# Patient Record
Sex: Female | Born: 1935
Health system: Southern US, Community
[De-identification: ages and names within clinical notes are randomized; demographics above are authoritative.]

## PROBLEM LIST (undated history)

## (undated) DIAGNOSIS — G259 Extrapyramidal and movement disorder, unspecified: Secondary | ICD-10-CM

## (undated) DIAGNOSIS — J3489 Other specified disorders of nose and nasal sinuses: Secondary | ICD-10-CM

## (undated) DIAGNOSIS — H539 Unspecified visual disturbance: Secondary | ICD-10-CM

## (undated) DIAGNOSIS — M1990 Unspecified osteoarthritis, unspecified site: Secondary | ICD-10-CM

## (undated) DIAGNOSIS — K5900 Constipation, unspecified: Secondary | ICD-10-CM

## (undated) DIAGNOSIS — I639 Cerebral infarction, unspecified: Secondary | ICD-10-CM

## (undated) DIAGNOSIS — N189 Chronic kidney disease, unspecified: Secondary | ICD-10-CM

## (undated) DIAGNOSIS — F419 Anxiety disorder, unspecified: Secondary | ICD-10-CM

## (undated) HISTORY — DX: Chronic kidney disease, unspecified: N18.9

## (undated) HISTORY — DX: Unspecified visual disturbance: H53.9

## (undated) HISTORY — PX: JOINT REPLACEMENT: SHX530

## (undated) HISTORY — PX: OTHER SURGICAL HISTORY: SHX169

## (undated) HISTORY — DX: Extrapyramidal and movement disorder, unspecified: G25.9

## (undated) HISTORY — PX: EYE SURGERY: SHX253

---

## 1998-04-01 ENCOUNTER — Other Ambulatory Visit: Admission: RE | Admit: 1998-04-01 | Discharge: 1998-04-01 | Payer: Self-pay | Admitting: Gynecology

## 1999-05-12 ENCOUNTER — Other Ambulatory Visit: Admission: RE | Admit: 1999-05-12 | Discharge: 1999-05-12 | Payer: Self-pay | Admitting: Gynecology

## 1999-07-17 ENCOUNTER — Ambulatory Visit (HOSPITAL_COMMUNITY): Admission: RE | Admit: 1999-07-17 | Discharge: 1999-07-17 | Payer: Self-pay | Admitting: Vascular Surgery

## 1999-07-17 ENCOUNTER — Encounter: Payer: Self-pay | Admitting: Vascular Surgery

## 2000-06-07 ENCOUNTER — Other Ambulatory Visit: Admission: RE | Admit: 2000-06-07 | Discharge: 2000-06-07 | Payer: Self-pay | Admitting: Gynecology

## 2003-08-20 ENCOUNTER — Other Ambulatory Visit: Admission: RE | Admit: 2003-08-20 | Discharge: 2003-08-20 | Payer: Self-pay | Admitting: Gynecology

## 2005-09-01 ENCOUNTER — Other Ambulatory Visit: Admission: RE | Admit: 2005-09-01 | Discharge: 2005-09-01 | Payer: Self-pay | Admitting: Gynecology

## 2008-10-02 ENCOUNTER — Encounter: Admission: RE | Admit: 2008-10-02 | Discharge: 2008-10-02 | Payer: Self-pay | Admitting: Orthopedic Surgery

## 2008-10-18 ENCOUNTER — Inpatient Hospital Stay (HOSPITAL_COMMUNITY): Admission: RE | Admit: 2008-10-18 | Discharge: 2008-10-22 | Payer: Self-pay | Admitting: Orthopedic Surgery

## 2009-04-01 ENCOUNTER — Ambulatory Visit (HOSPITAL_COMMUNITY): Admission: RE | Admit: 2009-04-01 | Discharge: 2009-04-01 | Payer: Self-pay | Admitting: Orthopedic Surgery

## 2009-09-04 ENCOUNTER — Ambulatory Visit (HOSPITAL_COMMUNITY): Admission: RE | Admit: 2009-09-04 | Discharge: 2009-09-04 | Payer: Self-pay | Admitting: Orthopedic Surgery

## 2010-07-01 ENCOUNTER — Encounter
Admission: RE | Admit: 2010-07-01 | Discharge: 2010-07-01 | Payer: Self-pay | Source: Home / Self Care | Attending: Family Medicine | Admitting: Family Medicine

## 2010-07-24 ENCOUNTER — Ambulatory Visit (HOSPITAL_COMMUNITY)
Admission: RE | Admit: 2010-07-24 | Discharge: 2010-07-24 | Payer: Self-pay | Source: Home / Self Care | Attending: Urology | Admitting: Urology

## 2010-07-24 ENCOUNTER — Encounter (INDEPENDENT_AMBULATORY_CARE_PROVIDER_SITE_OTHER): Payer: Self-pay

## 2010-08-03 ENCOUNTER — Encounter: Payer: Self-pay | Admitting: Family Medicine

## 2010-10-01 LAB — CREATININE, SERUM: Creatinine, Ser: 0.89 mg/dL (ref 0.4–1.2)

## 2010-10-22 LAB — BASIC METABOLIC PANEL
BUN: 20 mg/dL (ref 6–23)
Chloride: 104 mEq/L (ref 96–112)
Creatinine, Ser: 0.97 mg/dL (ref 0.4–1.2)
Glucose, Bld: 101 mg/dL — ABNORMAL HIGH (ref 70–99)
Potassium: 4 mEq/L (ref 3.5–5.1)

## 2010-10-22 LAB — HEMOGLOBIN AND HEMATOCRIT, BLOOD
HCT: 31.7 % — ABNORMAL LOW (ref 36.0–46.0)
Hemoglobin: 10.7 g/dL — ABNORMAL LOW (ref 12.0–15.0)

## 2010-10-22 LAB — TYPE AND SCREEN: Antibody Screen: NEGATIVE

## 2010-11-25 NOTE — Op Note (Signed)
[name], [name]               ACCOUNT NO.:  [id number]   MEDICAL RECORD NO.:  [id number]          PATIENT TYPE:  INP   LOCATION:  0003                         FACILITY:  [facility]   PHYSICIAN:  Marlowe Kays, M.D.  DATE OF BIRTH:  10-Oct-1923   DATE OF PROCEDURE:  10/18/2008  DATE OF DISCHARGE:                               OPERATIVE REPORT   PREOPERATIVE DIAGNOSIS:  Spinal stenosis L2-3, L3-4, L4-5.   POSTOPERATIVE DIAGNOSIS:  Spinal stenosis L2-3, L3-4, L4-5.   OPERATION:  Central and foraminal decompression L2-3, L3-4, L4-5.   SURGEON:  Marlowe Kays, M.D.   ASSISTANT:  Georges Lynch. Darrelyn Hillock, M.D.   ANESTHESIA:  General.   INDICATIONS FOR PROCEDURE:  She is having primary left groin and thigh  pain.  She has a total hip replacement on the right and some mild  arthritic changes in the left hip, but it was felt because of the type  of symptomatology she was having that her pain was primarily coming from  her back and not her hip joint.  She understands that she does have an  abnormal hip as well.   PROCEDURE:  Prophylactic antibiotics, Foley catheter was inserted,  carefully placed in the prone position on the Wilson frame.  Back was  prepped with DuraPrep, draped in sterile field.  Time-out performed.  I  made a midline incision and the spinous processes of what I thought were  probably about L2 and L4 were tagged with Kocher clamps and a lateral x-  ray was taken confirming that this was the case.  We extended the  incision more proximally and distally so we could go up to the L1  spinous process and down to L5.  We took second lateral x-ray with  Kocher clamps, confirming that the clamps were on L1 and L5.  Based on  this, we continued dissecting soft tissue off the neural arches in this  span, placed two self-retaining McCullough retractors.  With double-  action rongeur initially, I removed bone, ligamentum flavum, taking a  substantial portion of the spinous process L5  and most of L2 and all  bone and soft tissue in between.  We then continued initially with 2-  and 3-mm Kerrison rongeurs but soon brought in the microscope to  complete the dissection.  Her nerve roots on the left side were all very  irritable.  Wide decompression performed without any dural tear or known  complication.  We could place a hockey-stick cephalad, caudad and into  all the neural foramina without and everything was patent.  I irrigated  the wound well with sterile saline, placed Gelfoam soaked in thrombin  over the dura.  Self-retaining retractors were removed and there was no  unusual bleeding.  I then closed the wound in layers with interrupted #1  Vicryl and the paralumbar muscle and fascia leaving a 1.5 cm or so open  distally for any egress of fluid.  Subcutaneous tissue was closed with 2-  0 Vicryl,  staples in the skin.  Betadine, Adaptic and dry sterile dressing were  applied.  At the time of  this dictation, she is being taken to the  recovery room in satisfactory condition with no known complications.   ESTIMATED BLOOD LOSS:  250 mL.  No blood replacement.           ______________________________  Marlowe Kays, M.D.     JA/MEDQ  D:  10/18/2008  T:  10/18/2008  Job:  [id number]

## 2010-11-25 NOTE — H&P (Signed)
NAMEJORDYNN, [name]               ACCOUNT NO.:  [id number]   MEDICAL RECORD NO.:  [id number]          PATIENT TYPE:  INP   LOCATION:                               FACILITY:  [facility]   PHYSICIAN:  Marlowe Kays, M.D.  DATE OF BIRTH:  1924/03/03   DATE OF ADMISSION:  10/18/2008  DATE OF DISCHARGE:                              HISTORY & PHYSICAL   CHIEF COMPLAINT:  Pain in my left groin and thigh.   PRESENT ILLNESS:  This is an 77 year old white female seen by [place] for  progressive and interfering problems concerning pain in her left groin  and thigh.  At first, she and we thought it may be hip pathology as she  has successfully undergone a right total hip replacement arthroplasty  but x-rays have proved that she has minimal degenerative changes in her  left hip joint as well as negative range of motion.  Her pain is coming  from the lower lumbar spine, radiating around the buttock into the upper  lateral thigh and into the groin area or proximal inner thigh.  Standing  markedly increases her pain and discomfort, is relieved somewhat with  lying down and sitting.  She is an extremely active lady who is now  widowed, living alone and she highly desires to continue with her day-to-  day activities and not to have this interfere with her overall quality  of life.  CT scan myelogram has shown considerable amount of spinal  stenosis at the L2-L3, L3-L4, L4-L5 levels of the lumbar spine.  After  much discussion including the risks and benefits of the surgery, decided  to go ahead and schedule a decompressive lumbar laminectomy from L2-L5.  All questions encouraged and answered today following her history and  physical.  She plans to go to [facility] for her post hospitalization  rehabilitation and she will try to make as many arrangements as possible  for that prior to her hospitalization.   PAST MEDICAL HISTORY:  Again, this lady's been in excellent health  throughout her lifetime.  She  underwent right total knee replacement  arthroplasty in the not too distant past.  She has had bilateral  cataract removal with lens implants.  Dr. Huntley Dec is her family physician.   SOCIAL HISTORY:  She neither smokes nor drinks.  She is widowed and  retired.   ALLERGIES:  She has no medical allergies.   CURRENT MEDICATIONS:  1. Estrogen patch.  2. Calcium.  3. Vitamin D 81 mg daily (which she has stopped.).   FAMILY HISTORY:  Noncontributory.   REVIEW OF SYSTEMS:  CNS: No seizures or paralysis, numbness, double  vision.  RESPIRATORY: No productive cough, no hemoptysis, no shortness  of breath.  CARDIOVASCULAR: No chest pain or angina or orthopnea.  Gastrointestinal: No nausea,vomiting, melena, blood in stool.  GENITOURINARY:  No discharge, dysuria, hematuria.  MUSCULOSKELETAL:  Primarily in present illness.   PHYSICAL EXAMINATION:  GENERAL:  Alert and cooperative, friendly 77-year-  old white female looking much younger than stated age.  VITAL SIGNS:  Blood pressure 140/76, pulse 80, respirations 12.  HEENT:  Normocephalic. PERRLA, EOM intact.  Oropharynx is clear.  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  NECK:  Neck is supple, no lymphadenopathy.  CHEST:  Clear to auscultation, no rhonchi, no rales.  HEART: Regular rate and rhythm.  No murmurs are heard.  ABDOMEN:  Scaphoid, soft, nontender, spleen not felt.  GENITALIA, RECTAL, PELVIC, BREASTS:  Not done, not pertinent to present  illness.  EXTREMITIES:  Negative straight leg raise bilaterally.  She has gross  motor strength at 5/5.   ADMISSION DIAGNOSES:  1. Spinal stenosis L2-L3, L3-L4, L4-L5,  2. Status post right total hip replacement arthroplasty.  3. Status post bilateral cataract removal with lens implants.   PLAN:  The patient will undergo decompressive lumbar laminectomy of 2-5  with plans for rehabilitation after regular hospitalization at  [facility].      [name] June.     ______________________________  Marlowe Kays, M.D.    DLU/MEDQ  D:  10/10/2008  T:  10/10/2008  Job:  [id number]

## 2010-11-25 NOTE — Discharge Summary (Signed)
[name], [name]               ACCOUNT NO.:  [id number]   MEDICAL RECORD NO.:  [id number]          PATIENT TYPE:  INP   LOCATION:  1540                         FACILITY:  [facility]   PHYSICIAN:  Marlowe Kays, M.D.  DATE OF BIRTH:  01-Oct-1923   DATE OF ADMISSION:  10/18/2008  DATE OF DISCHARGE:  10/22/2008                               DISCHARGE SUMMARY   PREOPERATIVE DIAGNOSES:  1. Spinal stenosis, L2-3, L3-4, L4-5.  2. Osteoarthritis, left hip.   POSTOPERATIVE DIAGNOSES:  1. Spinal stenosis, L2-3, L3-4, L4-5.  2. Osteoarthritis, left hip.   OPERATION:  Central and foraminal decompression, L2-3, L3-4 and L4-5 on  October 14, 2008.   SUMMARY:  This 77 year old female was admitted at this time for central  and foraminal decompression, L2-3, L3-4 and L4-5.  She has a history of  total hip replacement on the right with good result.  She has some mild  osteoarthritis of her left hip associated with primarily left groin and  thigh pain.  The pain is more characteristic of back discomfort with no  particular pain on hip motion and a CT myelogram has shown a  considerable amount of stenosis at L2-3, L3-4 and L4-5.  She was  admitted at this time for the above-mentioned surgery with the  understanding that she did have an arthritic hip on the left and this  could be addressed later if needed.   PAST MEDICAL AND SURGICAL HISTORY:  All documented in the record and are  noncontributory except as noted above.   LABORATORY DATA:  She had a normal EKG and no lab abnormalities to  preclude surgery.   HOSPITAL COURSE:  The above-mentioned surgery was performed without  complication.  She was ambulating on day 1 and when able to use a  bedside commode and the bathroom her Foley was discontinued.  Her PCA  was discontinued on April 10.  Because she has no one to look after her  at home, arrangements were made from day 1 to be transferred to  [facility] and at this time  transfer arrangements have  been completed.  Her wound is healing nicely and she is afebrile and her  leg pain ha basically been relieved.   PLAN:  To see her back in 2 weeks from surgery in my office and to call  [phone] for an appointment.   She will be discharged on her admission medications of:  1. Aspirin 81 mg daily.  2. Vitamin D3.  3. Citracal.  4. Vivelle-Dot 0.075% one-half dose daily on Sunday and Thursday.  5. Vicodin one to two every 3 to 4 hours p.r.n. for pain.  6. Robaxin 500 mg p.o. q.6h. p.r.n. for spasm.   She does not need to wear her support hose since she is now ambulatory  and on aspirin.   CONDITION AT DISCHARGE:  Stable and improved.           ______________________________  Marlowe Kays, M.D.     JA/MEDQ  D:  10/22/2008  T:  10/22/2008  Job:  [id number]

## 2011-08-24 DIAGNOSIS — M169 Osteoarthritis of hip, unspecified: Secondary | ICD-10-CM | POA: Diagnosis not present

## 2011-08-24 DIAGNOSIS — M25569 Pain in unspecified knee: Secondary | ICD-10-CM | POA: Diagnosis not present

## 2011-12-14 DIAGNOSIS — M1710 Unilateral primary osteoarthritis, unspecified knee: Secondary | ICD-10-CM | POA: Diagnosis not present

## 2011-12-14 DIAGNOSIS — M169 Osteoarthritis of hip, unspecified: Secondary | ICD-10-CM | POA: Diagnosis not present

## 2012-02-10 DIAGNOSIS — M169 Osteoarthritis of hip, unspecified: Secondary | ICD-10-CM | POA: Diagnosis not present

## 2012-02-19 DIAGNOSIS — Z8673 Personal history of transient ischemic attack (TIA), and cerebral infarction without residual deficits: Secondary | ICD-10-CM | POA: Diagnosis not present

## 2012-02-19 DIAGNOSIS — E782 Mixed hyperlipidemia: Secondary | ICD-10-CM | POA: Diagnosis not present

## 2012-02-19 DIAGNOSIS — M25559 Pain in unspecified hip: Secondary | ICD-10-CM | POA: Diagnosis not present

## 2012-02-19 DIAGNOSIS — M810 Age-related osteoporosis without current pathological fracture: Secondary | ICD-10-CM | POA: Diagnosis not present

## 2012-02-19 DIAGNOSIS — F411 Generalized anxiety disorder: Secondary | ICD-10-CM | POA: Diagnosis not present

## 2012-02-19 DIAGNOSIS — E559 Vitamin D deficiency, unspecified: Secondary | ICD-10-CM | POA: Diagnosis not present

## 2012-02-19 DIAGNOSIS — H8110 Benign paroxysmal vertigo, unspecified ear: Secondary | ICD-10-CM | POA: Diagnosis not present

## 2012-02-24 ENCOUNTER — Encounter (HOSPITAL_COMMUNITY): Payer: Self-pay

## 2012-03-01 NOTE — Patient Instructions (Addendum)
20 [name]  03/01/2012   Your procedure is scheduled on:  03/08/12 1200noon-130pm  Report to [facility] at 0930 AM.  Call this number if you have problems the morning of surgery: [phone]   Remember:   Do not eat food:After Midnight.  May have clear liquids:until Midnight .    Take these medicines the morning of surgery with A SIP OF WATER:   Do not wear jewelry, make-up or nail polish.  Do not wear lotions, powders, or perfumes.   Do not shave 48 hours prior to surgery. .  Do not bring valuables to the hospital.  Contacts, dentures or bridgework may not be worn into surgery.  Leave suitcase in the car. After surgery it may be brought to your room.  For patients admitted to the hospital, checkout time is 11:00 AM the day of discharge.       Special Instructions: CHG Shower Use Special Wash: 1/2 bottle night before surgery and 1/2 bottle morning of surgery. shower chin to toes with CHG.  Wash face and private parts with regular soap.    Please read over the following fact sheets that you were given: MRSA Information, Incentive Spirometry Fact Sheet, Blood Transfusion Fact Sheet, coughing and deep breathing exercises, leg exercises

## 2012-03-02 ENCOUNTER — Encounter (HOSPITAL_COMMUNITY)
Admission: RE | Admit: 2012-03-02 | Discharge: 2012-03-02 | Disposition: A | Discharge status: Home / Self Care | Payer: Medicare Other | Source: Ambulatory Visit | Attending: Orthopedic Surgery | Admitting: Orthopedic Surgery

## 2012-03-02 ENCOUNTER — Encounter (HOSPITAL_COMMUNITY): Payer: Self-pay

## 2012-03-02 ENCOUNTER — Ambulatory Visit (HOSPITAL_COMMUNITY)
Admission: RE | Admit: 2012-03-02 | Discharge: 2012-03-02 | Disposition: A | Discharge status: Home / Self Care | Payer: Medicare Other | Source: Ambulatory Visit | Attending: Orthopedic Surgery | Admitting: Orthopedic Surgery

## 2012-03-02 DIAGNOSIS — Z01812 Encounter for preprocedural laboratory examination: Secondary | ICD-10-CM | POA: Insufficient documentation

## 2012-03-02 DIAGNOSIS — M169 Osteoarthritis of hip, unspecified: Secondary | ICD-10-CM | POA: Diagnosis not present

## 2012-03-02 DIAGNOSIS — Z01818 Encounter for other preprocedural examination: Secondary | ICD-10-CM | POA: Insufficient documentation

## 2012-03-02 DIAGNOSIS — R918 Other nonspecific abnormal finding of lung field: Secondary | ICD-10-CM | POA: Diagnosis not present

## 2012-03-02 HISTORY — DX: Cerebral infarction, unspecified: I63.9

## 2012-03-02 HISTORY — DX: Other specified disorders of nose and nasal sinuses: J34.89

## 2012-03-02 HISTORY — DX: Anxiety disorder, unspecified: F41.9

## 2012-03-02 HISTORY — DX: Unspecified osteoarthritis, unspecified site: M19.90

## 2012-03-02 HISTORY — DX: Constipation, unspecified: K59.00

## 2012-03-02 LAB — URINALYSIS, ROUTINE W REFLEX MICROSCOPIC
Hgb urine dipstick: NEGATIVE
Nitrite: NEGATIVE
Protein, ur: NEGATIVE mg/dL
Urobilinogen, UA: 0.2 mg/dL (ref 0.0–1.0)

## 2012-03-02 LAB — PROTIME-INR
INR: 1.01 (ref 0.00–1.49)
Prothrombin Time: 13.5 seconds (ref 11.6–15.2)

## 2012-03-02 LAB — SURGICAL PCR SCREEN: MRSA, PCR: NEGATIVE

## 2012-03-02 LAB — APTT: aPTT: 32 seconds (ref 24–37)

## 2012-03-02 NOTE — H&P (Signed)
[name] is an 78 y.o. female.    Chief Complaint:    Left hip OA and pain   HPI: Pt is a 77 y.o. female complaining of left hip pain for 1+ years. Pain had continually increased since the beginning. X-rays in the clinic show end-stage arthritic changes of the left hip. Pt has tried various conservative treatments which have failed to alleviate their symptoms, including intraarticular and lateral steroid injections. Various options are discussed with the patient. Risks, benefits and expectations were discussed with the patient. Patient understand the risks, benefits and expectations and wishes to proceed with surgery.   PCP:  No primary provider on file.  D/C Plans:  [facility]NF[facility]Rehab - [facility]   Post-op Meds:  No Rx given   Tranexamic Acid:   Not to be given - Previous mini-stroke  Decadron:   To be given  PMH: Past Medical History  Diagnosis Date  . Anxiety   . Stroke     hx of ministroke in 2011   . Arthritis   . Sinus drainage   . Constipation     PSH: Past Surgical History  Procedure Date  . Joint replacement     right hip   . Tumor removed right breast      benign   . Eye surgery     bilateral cataract surgery     Social History:  reports that she has never smoked. She has never used smokeless tobacco. She reports that she does not use illicit drugs. Her alcohol history not on file.  Allergies:  No Known Allergies  Medications: No current facility-administered medications for this encounter.   Current Outpatient Prescriptions  Medication Sig Dispense Refill  . ALPRAZolam (XANAX) 0.5 MG tablet Take 0.25 mg by mouth daily as needed. Anxiety      . aspirin 81 MG tablet Take 81 mg by mouth every morning.      . Cholecalciferol (VITAMIN D) 2000 UNITS tablet Take 2,000 Units by mouth every morning.      . loratadine (CLARITIN) 10 MG tablet Take 10 mg by mouth every morning.        Results for orders placed during the hospital encounter of 03/02/12 (from the  past 48 hour(s))  APTT     Status: Normal   Collection Time   03/02/12 11:25 AM      Component Value Range Comment   aPTT 32  24 - 37 seconds   PROTIME-INR     Status: Normal   Collection Time   03/02/12 11:25 AM      Component Value Range Comment   Prothrombin Time 13.5  11.6 - 15.2 seconds    INR 1.01  0.00 - 1.49   URINALYSIS, ROUTINE W REFLEX MICROSCOPIC     Status: Normal   Collection Time   03/02/12 11:25 AM      Component Value Range Comment   Color, Urine YELLOW  YELLOW    APPearance CLEAR  CLEAR    Specific Gravity, Urine 1.016  1.005 - 1.030    pH 6.0  5.0 - 8.0    Glucose, UA NEGATIVE  NEGATIVE mg/dL    Hgb urine dipstick NEGATIVE  NEGATIVE    Bilirubin Urine NEGATIVE  NEGATIVE    Ketones, ur NEGATIVE  NEGATIVE mg/dL    Protein, ur NEGATIVE  NEGATIVE mg/dL    Urobilinogen, UA 0.2  0.0 - 1.0 mg/dL    Nitrite NEGATIVE  NEGATIVE    Leukocytes, UA NEGATIVE  NEGATIVE  SURGICAL PCR SCREEN     Status: Normal   Collection Time   03/02/12 11:25 AM      Component Value Range Comment   MRSA, PCR NEGATIVE  NEGATIVE    Staphylococcus aureus NEGATIVE  NEGATIVE    Dg Chest 2 View  03/02/2012  *RADIOLOGY REPORT*  Clinical Data: Preop left hip surgery.  CHEST - 2 VIEW  Comparison: None.  Findings: Hyperinflation of the lungs. Heart and mediastinal contours are within normal limits.  No focal opacities or effusions.  No acute bony abnormality.  IMPRESSION: Hyperinflation.  No active disease.   Original Report Authenticated By: Cyndie Chime, M.D.     ROS: Review of Systems  Constitutional: Positive for malaise/fatigue.  HENT: Negative.   Eyes: Negative.   Respiratory: Negative.   Cardiovascular: Negative.   Gastrointestinal: Positive for constipation.  Genitourinary: Negative.   Musculoskeletal: Positive for joint pain.  Skin: Negative.   Neurological: Negative.   Endo/Heme/Allergies: Negative.   Psychiatric/Behavioral: Negative.      Physical Exam: BP:   135/49  ;  HR:    90  ; Resp:   16  : Physical Exam  Constitutional: She is oriented to person, place, and time and well-developed, well-nourished, and in no distress.  HENT:  Head: Normocephalic and atraumatic.  Nose: Nose normal.  Mouth/Throat: Oropharynx is clear and moist.  Eyes: Pupils are equal, round, and reactive to light.  Neck: Neck supple. No JVD present. No tracheal deviation present. No thyromegaly present.  Cardiovascular: Normal rate, regular rhythm, normal heart sounds and intact distal pulses.   Pulmonary/Chest: Effort normal and breath sounds normal. No stridor. No respiratory distress. She has no wheezes.  Abdominal: Soft. There is no tenderness. There is no guarding.  Lymphadenopathy:    She has no cervical adenopathy.  Neurological: She is alert and oriented to person, place, and time.  Skin: Skin is warm and dry.  Psychiatric: Affect normal.      Assessment/Plan Assessment:   Left hip OA and pain   Plan: Patient will undergo a left total hip arthroplasty, anterior approach on 03/08/2012 per Dr. Charlann Boxer at [facility]. Risks benefits and expectations were discussed with the patient. Patient understand risks, benefits and expectations and wishes to proceed.   [name]   PAC  03/02/2012, 2:40 PM

## 2012-03-08 ENCOUNTER — Inpatient Hospital Stay (HOSPITAL_COMMUNITY): Payer: Medicare Other

## 2012-03-08 ENCOUNTER — Inpatient Hospital Stay (HOSPITAL_COMMUNITY)
Admission: RE | Admit: 2012-03-08 | Discharge: 2012-03-11 | DRG: 470 | Disposition: A | Discharge status: Skilled Nursing Facility | Payer: Medicare Other | Source: Ambulatory Visit | Attending: Orthopedic Surgery | Admitting: Orthopedic Surgery

## 2012-03-08 ENCOUNTER — Encounter (HOSPITAL_COMMUNITY)
Admission: RE | Disposition: A | Discharge status: Skilled Nursing Facility | Payer: Self-pay | Source: Ambulatory Visit | Attending: Orthopedic Surgery

## 2012-03-08 ENCOUNTER — Inpatient Hospital Stay (HOSPITAL_COMMUNITY): Payer: Medicare Other | Admitting: Anesthesiology

## 2012-03-08 ENCOUNTER — Encounter (HOSPITAL_COMMUNITY): Payer: Self-pay | Admitting: Anesthesiology

## 2012-03-08 ENCOUNTER — Encounter (HOSPITAL_COMMUNITY): Payer: Self-pay | Admitting: *Deleted

## 2012-03-08 DIAGNOSIS — M1610 Unilateral primary osteoarthritis, unspecified hip: Principal | ICD-10-CM | POA: Diagnosis present

## 2012-03-08 DIAGNOSIS — M169 Osteoarthritis of hip, unspecified: Principal | ICD-10-CM | POA: Diagnosis present

## 2012-03-08 DIAGNOSIS — Z96649 Presence of unspecified artificial hip joint: Secondary | ICD-10-CM | POA: Diagnosis not present

## 2012-03-08 DIAGNOSIS — K5900 Constipation, unspecified: Secondary | ICD-10-CM | POA: Diagnosis present

## 2012-03-08 DIAGNOSIS — D62 Acute posthemorrhagic anemia: Secondary | ICD-10-CM | POA: Diagnosis not present

## 2012-03-08 DIAGNOSIS — M25559 Pain in unspecified hip: Secondary | ICD-10-CM | POA: Diagnosis not present

## 2012-03-08 DIAGNOSIS — F411 Generalized anxiety disorder: Secondary | ICD-10-CM | POA: Diagnosis present

## 2012-03-08 DIAGNOSIS — R5381 Other malaise: Secondary | ICD-10-CM | POA: Diagnosis not present

## 2012-03-08 DIAGNOSIS — M1990 Unspecified osteoarthritis, unspecified site: Secondary | ICD-10-CM | POA: Diagnosis not present

## 2012-03-08 DIAGNOSIS — Z79899 Other long term (current) drug therapy: Secondary | ICD-10-CM | POA: Diagnosis not present

## 2012-03-08 DIAGNOSIS — Z471 Aftercare following joint replacement surgery: Secondary | ICD-10-CM | POA: Diagnosis not present

## 2012-03-08 HISTORY — PX: TOTAL HIP ARTHROPLASTY: SHX124

## 2012-03-08 SURGERY — ARTHROPLASTY, HIP, TOTAL, ANTERIOR APPROACH
Anesthesia: General | Site: Hip | Laterality: Left | Wound class: Clean

## 2012-03-08 MED ORDER — 0.9 % SODIUM CHLORIDE (POUR BTL) OPTIME
TOPICAL | Status: DC | PRN
  Administered 2012-03-08: 1000 mL

## 2012-03-08 MED ORDER — FENTANYL CITRATE 0.05 MG/ML IJ SOLN
INTRAMUSCULAR | Status: AC
  Filled 2012-03-08: qty 2

## 2012-03-08 MED ORDER — ZOLPIDEM TARTRATE 5 MG PO TABS: 5.0000 mg | ORAL_TABLET | Freq: Every evening | ORAL | Status: DC | PRN

## 2012-03-08 MED ORDER — MENTHOL 3 MG MT LOZG: 1.0000 | LOZENGE | OROMUCOSAL | Status: DC | PRN

## 2012-03-08 MED ORDER — HYDROMORPHONE HCL PF 1 MG/ML IJ SOLN: 0.5000 mg | INTRAMUSCULAR | Status: DC | PRN

## 2012-03-08 MED ORDER — DEXAMETHASONE SODIUM PHOSPHATE 10 MG/ML IJ SOLN
INTRAMUSCULAR | Status: DC | PRN
  Administered 2012-03-08: 10 mg via INTRAVENOUS

## 2012-03-08 MED ORDER — DIPHENHYDRAMINE HCL 25 MG PO CAPS: 25.0000 mg | ORAL_CAPSULE | Freq: Four times a day (QID) | ORAL | Status: DC | PRN

## 2012-03-08 MED ORDER — LACTATED RINGERS IV SOLN: INTRAVENOUS | Status: DC

## 2012-03-08 MED ORDER — RIVAROXABAN 10 MG PO TABS
10.0000 mg | ORAL_TABLET | ORAL | Status: DC
  Administered 2012-03-09 – 2012-03-11 (×3): 10 mg via ORAL
  Filled 2012-03-08 (×3): qty 1

## 2012-03-08 MED ORDER — GLYCOPYRROLATE 0.2 MG/ML IJ SOLN
INTRAMUSCULAR | Status: DC | PRN
  Administered 2012-03-08: 0.4 mg via INTRAVENOUS

## 2012-03-08 MED ORDER — HETASTARCH-ELECTROLYTES 6 % IV SOLN
INTRAVENOUS | Status: DC | PRN
  Administered 2012-03-08: 13:00:00 via INTRAVENOUS

## 2012-03-08 MED ORDER — FENTANYL CITRATE 0.05 MG/ML IJ SOLN
INTRAMUSCULAR | Status: DC | PRN
  Administered 2012-03-08: 50 ug via INTRAVENOUS
  Administered 2012-03-08: 100 ug via INTRAVENOUS
  Administered 2012-03-08: 50 ug via INTRAVENOUS
  Administered 2012-03-08: 100 ug via INTRAVENOUS
  Administered 2012-03-08 (×3): 50 ug via INTRAVENOUS

## 2012-03-08 MED ORDER — CEFAZOLIN SODIUM-DEXTROSE 2-3 GM-% IV SOLR
2.0000 g | Freq: Four times a day (QID) | INTRAVENOUS | Status: AC
  Administered 2012-03-08 (×2): 2 g via INTRAVENOUS
  Filled 2012-03-08 (×3): qty 50

## 2012-03-08 MED ORDER — PHENYLEPHRINE HCL 10 MG/ML IJ SOLN
INTRAMUSCULAR | Status: DC | PRN
  Administered 2012-03-08 (×2): 80 ug via INTRAVENOUS

## 2012-03-08 MED ORDER — DOCUSATE SODIUM 100 MG PO CAPS
100.0000 mg | ORAL_CAPSULE | Freq: Two times a day (BID) | ORAL | Status: DC
  Administered 2012-03-08 – 2012-03-11 (×5): 100 mg via ORAL

## 2012-03-08 MED ORDER — FLEET ENEMA 7-19 GM/118ML RE ENEM: 1.0000 | ENEMA | Freq: Once | RECTAL | Status: AC | PRN

## 2012-03-08 MED ORDER — CELECOXIB 200 MG PO CAPS
200.0000 mg | ORAL_CAPSULE | Freq: Two times a day (BID) | ORAL | Status: DC
  Administered 2012-03-10: 200 mg via ORAL
  Filled 2012-03-08 (×7): qty 1

## 2012-03-08 MED ORDER — ACETAMINOPHEN 10 MG/ML IV SOLN
INTRAVENOUS | Status: DC | PRN
  Administered 2012-03-08: 1000 mg via INTRAVENOUS

## 2012-03-08 MED ORDER — LORATADINE 10 MG PO TABS
10.0000 mg | ORAL_TABLET | Freq: Every morning | ORAL | Status: DC
  Administered 2012-03-09 – 2012-03-11 (×2): 10 mg via ORAL
  Filled 2012-03-08 (×3): qty 1

## 2012-03-08 MED ORDER — ONDANSETRON HCL 4 MG/2ML IJ SOLN: 4.0000 mg | Freq: Four times a day (QID) | INTRAMUSCULAR | Status: DC | PRN

## 2012-03-08 MED ORDER — PROMETHAZINE HCL 25 MG/ML IJ SOLN: 6.2500 mg | INTRAMUSCULAR | Status: DC | PRN

## 2012-03-08 MED ORDER — NEOSTIGMINE METHYLSULFATE 1 MG/ML IJ SOLN
INTRAMUSCULAR | Status: DC | PRN
  Administered 2012-03-08: 3 mg via INTRAVENOUS

## 2012-03-08 MED ORDER — LIDOCAINE HCL (CARDIAC) 20 MG/ML IV SOLN
INTRAVENOUS | Status: DC | PRN
  Administered 2012-03-08: 80 mg via INTRAVENOUS

## 2012-03-08 MED ORDER — CHLORHEXIDINE GLUCONATE 4 % EX LIQD
60.0000 mL | Freq: Once | CUTANEOUS | Status: DC
  Filled 2012-03-08: qty 60

## 2012-03-08 MED ORDER — POTASSIUM CHLORIDE 2 MEQ/ML IV SOLN
100.0000 mL/h | INTRAVENOUS | Status: DC
  Administered 2012-03-08 – 2012-03-09 (×3): 100 mL/h via INTRAVENOUS
  Filled 2012-03-08 (×15): qty 1000

## 2012-03-08 MED ORDER — ALPRAZOLAM 0.25 MG PO TABS: 0.2500 mg | ORAL_TABLET | Freq: Every day | ORAL | Status: DC | PRN

## 2012-03-08 MED ORDER — LACTATED RINGERS IV SOLN
INTRAVENOUS | Status: DC
  Administered 2012-03-08: 1000 mL via INTRAVENOUS

## 2012-03-08 MED ORDER — ROCURONIUM BROMIDE 100 MG/10ML IV SOLN
INTRAVENOUS | Status: DC | PRN
  Administered 2012-03-08: 20 mg via INTRAVENOUS
  Administered 2012-03-08: 50 mg via INTRAVENOUS

## 2012-03-08 MED ORDER — ALUM & MAG HYDROXIDE-SIMETH 200-200-20 MG/5ML PO SUSP
30.0000 mL | ORAL | Status: DC | PRN
  Administered 2012-03-10: 30 mL via ORAL
  Filled 2012-03-08: qty 30

## 2012-03-08 MED ORDER — CEFAZOLIN SODIUM-DEXTROSE 2-3 GM-% IV SOLR
2.0000 g | INTRAVENOUS | Status: AC
  Administered 2012-03-08: 2 g via INTRAVENOUS

## 2012-03-08 MED ORDER — HYDROCODONE-ACETAMINOPHEN 7.5-325 MG PO TABS
1.0000 | ORAL_TABLET | ORAL | Status: DC
  Administered 2012-03-08: 2 via ORAL
  Administered 2012-03-09 (×4): 1 via ORAL
  Administered 2012-03-10 (×4): 2 via ORAL
  Administered 2012-03-11 (×2): 1 via ORAL
  Filled 2012-03-08: qty 1
  Filled 2012-03-08 (×3): qty 2
  Filled 2012-03-08: qty 1
  Filled 2012-03-08: qty 2
  Filled 2012-03-08: qty 1
  Filled 2012-03-08 (×2): qty 2
  Filled 2012-03-08: qty 1
  Filled 2012-03-08: qty 2
  Filled 2012-03-08: qty 1

## 2012-03-08 MED ORDER — ONDANSETRON HCL 4 MG/2ML IJ SOLN
INTRAMUSCULAR | Status: DC | PRN
  Administered 2012-03-08: 4 mg via INTRAVENOUS

## 2012-03-08 MED ORDER — LACTATED RINGERS IV SOLN
INTRAVENOUS | Status: DC | PRN
  Administered 2012-03-08 (×3): via INTRAVENOUS

## 2012-03-08 MED ORDER — POLYETHYLENE GLYCOL 3350 17 G PO PACK
17.0000 g | PACK | Freq: Two times a day (BID) | ORAL | Status: DC
  Administered 2012-03-08 – 2012-03-11 (×5): 17 g via ORAL

## 2012-03-08 MED ORDER — METHOCARBAMOL 100 MG/ML IJ SOLN
500.0000 mg | Freq: Four times a day (QID) | INTRAVENOUS | Status: DC | PRN
  Administered 2012-03-08: 500 mg via INTRAVENOUS
  Filled 2012-03-08: qty 5

## 2012-03-08 MED ORDER — METOCLOPRAMIDE HCL 5 MG/ML IJ SOLN: 5.0000 mg | Freq: Three times a day (TID) | INTRAMUSCULAR | Status: DC | PRN

## 2012-03-08 MED ORDER — DEXAMETHASONE SODIUM PHOSPHATE 10 MG/ML IJ SOLN
10.0000 mg | Freq: Once | INTRAMUSCULAR | Status: DC
  Filled 2012-03-08: qty 1

## 2012-03-08 MED ORDER — PHENOL 1.4 % MT LIQD: 1.0000 | OROMUCOSAL | Status: DC | PRN

## 2012-03-08 MED ORDER — PROPOFOL 10 MG/ML IV BOLUS
INTRAVENOUS | Status: DC | PRN
  Administered 2012-03-08: 150 mg via INTRAVENOUS

## 2012-03-08 MED ORDER — ONDANSETRON HCL 4 MG PO TABS: 4.0000 mg | ORAL_TABLET | Freq: Four times a day (QID) | ORAL | Status: DC | PRN

## 2012-03-08 MED ORDER — METOCLOPRAMIDE HCL 10 MG PO TABS: 5.0000 mg | ORAL_TABLET | Freq: Three times a day (TID) | ORAL | Status: DC | PRN

## 2012-03-08 MED ORDER — FERROUS SULFATE 325 (65 FE) MG PO TABS
325.0000 mg | ORAL_TABLET | Freq: Three times a day (TID) | ORAL | Status: DC
  Administered 2012-03-08 – 2012-03-11 (×5): 325 mg via ORAL
  Filled 2012-03-08 (×11): qty 1

## 2012-03-08 MED ORDER — FENTANYL CITRATE 0.05 MG/ML IJ SOLN
25.0000 ug | INTRAMUSCULAR | Status: DC | PRN
  Administered 2012-03-08: 25 ug via INTRAVENOUS

## 2012-03-08 MED ORDER — METHOCARBAMOL 500 MG PO TABS
500.0000 mg | ORAL_TABLET | Freq: Four times a day (QID) | ORAL | Status: DC | PRN
  Administered 2012-03-09 – 2012-03-10 (×3): 500 mg via ORAL
  Filled 2012-03-08 (×3): qty 1

## 2012-03-08 MED ORDER — BISACODYL 10 MG RE SUPP: 10.0000 mg | Freq: Every day | RECTAL | Status: DC | PRN

## 2012-03-08 SURGICAL SUPPLY — 38 items
ADH SKN CLS APL DERMABOND .7 (GAUZE/BANDAGES/DRESSINGS) ×1
BAG SPEC THK2 15X12 ZIP CLS (MISCELLANEOUS) ×2
BAG ZIPLOCK 12X15 (MISCELLANEOUS) ×4 IMPLANT
BLADE SAW SGTL 18X1.27X75 (BLADE) ×2 IMPLANT
CLOTH BEACON ORANGE TIMEOUT ST (SAFETY) ×2 IMPLANT
DERMABOND ADVANCED (GAUZE/BANDAGES/DRESSINGS) ×1
DERMABOND ADVANCED .7 DNX12 (GAUZE/BANDAGES/DRESSINGS) ×1 IMPLANT
DRAPE C-ARM 42X72 X-RAY (DRAPES) ×2 IMPLANT
DRAPE STERI IOBAN 125X83 (DRAPES) ×2 IMPLANT
DRAPE U-SHAPE 47X51 STRL (DRAPES) ×6 IMPLANT
DRSG AQUACEL AG ADV 3.5X10 (GAUZE/BANDAGES/DRESSINGS) ×2 IMPLANT
DRSG TEGADERM 4X4.75 (GAUZE/BANDAGES/DRESSINGS) ×1 IMPLANT
DURAPREP 26ML APPLICATOR (WOUND CARE) ×2 IMPLANT
ELECT BLADE TIP CTD 4 INCH (ELECTRODE) ×2 IMPLANT
ELECT REM PT RETURN 9FT ADLT (ELECTROSURGICAL) ×2
ELECTRODE REM PT RTRN 9FT ADLT (ELECTROSURGICAL) ×1 IMPLANT
EVACUATOR 1/8 PVC DRAIN (DRAIN) IMPLANT
FACESHIELD LNG OPTICON STERILE (SAFETY) ×8 IMPLANT
GAUZE SPONGE 2X2 8PLY STRL LF (GAUZE/BANDAGES/DRESSINGS) ×1 IMPLANT
GLOVE BIOGEL PI IND STRL 7.5 (GLOVE) ×1 IMPLANT
GLOVE BIOGEL PI IND STRL 8 (GLOVE) ×1 IMPLANT
GLOVE BIOGEL PI INDICATOR 7.5 (GLOVE) ×1
GLOVE BIOGEL PI INDICATOR 8 (GLOVE) ×1
GLOVE ECLIPSE 8.0 STRL XLNG CF (GLOVE) ×2 IMPLANT
GLOVE ORTHO TXT STRL SZ7.5 (GLOVE) ×4 IMPLANT
GOWN BRE IMP PREV XXLGXLNG (GOWN DISPOSABLE) ×4 IMPLANT
GOWN STRL NON-REIN LRG LVL3 (GOWN DISPOSABLE) ×2 IMPLANT
KIT BASIN OR (CUSTOM PROCEDURE TRAY) ×2 IMPLANT
PACK TOTAL JOINT (CUSTOM PROCEDURE TRAY) ×2 IMPLANT
PADDING CAST COTTON 6X4 STRL (CAST SUPPLIES) ×2 IMPLANT
SPONGE GAUZE 2X2 STER 10/PKG (GAUZE/BANDAGES/DRESSINGS) ×1
SUCTION FRAZIER 12FR DISP (SUCTIONS) ×2 IMPLANT
SUT MNCRL AB 4-0 PS2 18 (SUTURE) ×2 IMPLANT
SUT VIC AB 1 CT1 36 (SUTURE) ×8 IMPLANT
SUT VIC AB 2-0 CT1 27 (SUTURE) ×4
SUT VIC AB 2-0 CT1 TAPERPNT 27 (SUTURE) ×2 IMPLANT
TOWEL OR 17X26 10 PK STRL BLUE (TOWEL DISPOSABLE) ×4 IMPLANT
TRAY FOLEY CATH 14FRSI W/METER (CATHETERS) ×2 IMPLANT

## 2012-03-08 NOTE — Anesthesia Postprocedure Evaluation (Signed)
Anesthesia Post Note  Patient: [name]  Procedure(s) Performed: Procedure(s) (LRB): TOTAL HIP ARTHROPLASTY ANTERIOR APPROACH (Left)  Anesthesia type: General  Patient location: PACU  Post pain: Pain level controlled  Post assessment: Post-op Vital signs reviewed  Last Vitals:  Filed Vitals:   03/08/12 2029  BP: 94/61  Pulse: 97  Temp: 36.5 C  Resp: 16    Post vital signs: Reviewed  Level of consciousness: sedated  Complications: No apparent anesthesia complications

## 2012-03-08 NOTE — Progress Notes (Signed)
Dr Rica Mast notified : while taking PO temp on floor-noted, pt tongue tip purple.  Pt denies any pain or discomfort.  No orders received.

## 2012-03-08 NOTE — Progress Notes (Signed)
Utilization review completed.  

## 2012-03-08 NOTE — Transfer of Care (Signed)
Immediate Anesthesia Transfer of Care Note  Patient: [name]  Procedure(s) Performed: Procedure(s) (LRB): TOTAL HIP ARTHROPLASTY ANTERIOR APPROACH (Left)  Patient Location: PACU  Anesthesia Type: General  Level of Consciousness: awake, alert , oriented and patient cooperative  Airway & Oxygen Therapy: Patient Spontanous Breathing and Patient connected to face mask oxygen  Post-op Assessment: Report given to PACU RN, Post -op Vital signs reviewed and stable and Patient moving all extremities X 4  Post vital signs: stable  Complications: No apparent anesthesia complications

## 2012-03-08 NOTE — Op Note (Signed)
NAME:  [name]                ACCOUNT NO.: [id number]      MEDICAL RECORD NO.: [id number]      FACILITY:  [facility]      PHYSICIAN:  Durene Romans D  DATE OF BIRTH:  [birth year]     DATE OF PROCEDURE:  03/08/2012                                 OPERATIVE REPORT         PREOPERATIVE DIAGNOSIS: Left hip osteoarthritis.      POSTOPERATIVE DIAGNOSIS:  Left hip osteoarthritis.      PROCEDURE:  Left total hip replacement through an anterior approach   utilizing DePuy THR system, component size 56mm pinnacle cup, a size 36+4 neutral   Altrex liner, a size 5 Hi Tri Lock stem with a 36+5 metal ball.      SURGEON:  Madlyn Frankel. Charlann Boxer, M.D.      ASSISTANT:  Lanney Gins, PA      ANESTHESIA:  GET.      SPECIMENS:  None.      COMPLICATIONS:  None.      BLOOD LOSS:  1100 cc     DRAINS:  One Hemovac.      INDICATION OF THE PROCEDURE:  [name] is a 78 y.o. female who had   presented to office for evaluation of left hip pain.  Radiographs revealed   progressive degenerative changes with bone-on-bone   articulation to the  hip joint.  The patient had painful limited range of   motion significantly affecting their overall quality of life.  The patient was failing to    respond to conservative measures, and at this point was ready   to proceed with more definitive measures.  The patient has noted progressive   degenerative changes in his hip, progressive problems and dysfunction   with regarding the hip prior to surgery.  Consent was obtained for   benefit of pain relief.  Specific risk of infection, DVT, component   failure, dislocation, need for revision surgery, as well discussion of   the anterior versus posterior approach were reviewed.  Consent was   obtained for benefit of anterior pain relief through an anterior   approach.      PROCEDURE IN DETAIL:  The patient was brought to operative theater.   Once adequate anesthesia, preoperative antibiotics,  2gm Ancef administered.   The patient was positioned supine on the OSI Hanna table.  Once adequate   padding of boney process was carried out, we had predraped out the hip, and  used fluoroscopy to confirm orientation of the pelvis and position.      The left hip was then prepped and draped from proximal iliac crest to   mid thigh with shower curtain technique.      Time-out was performed identifying the patient, planned procedure, and   extremity.     An incision was then made 2 cm distal and lateral to the   anterior superior iliac spine extending over the orientation of the   tensor fascia lata muscle and sharp dissection was carried down to the   fascia of the muscle and protractor placed in the soft tissues.      The fascia was then incised.  The muscle belly was identified and swept   laterally and retractor  placed along the superior neck.  Following   cauterization of the circumflex vessels and removing some pericapsular   fat, a second cobra retractor was placed on the inferior neck.  A third   retractor was placed on the anterior acetabulum after elevating the   anterior rectus.  A L-capsulotomy was along the line of the   superior neck to the trochanteric fossa, then extended proximally and   distally.  Tag sutures were placed and the retractors were then placed   intracapsular.  We then identified the trochanteric fossa and   orientation of my neck cut, confirmed this radiographically   and then made a neck osteotomy with the femur on traction.  The femoral   head was removed without difficulty or complication.  Traction was let   off and retractors were placed posterior and anterior around the   acetabulum.      The labrum and foveal tissue were debrided.  I began reaming with a 47mm   reamer and reamed up to 55mm reamer with good bony bed preparation and a 56   cup was chosen.  The final 56mm Pinnacle cup was then impacted under fluoroscopy  to confirm the depth of  penetration and orientation with respect to   abduction.  A screw was placed followed by the hole eliminator.  The final   36+4 Altrex liner was impacted with good visualized rim fit.  The cup was positioned anatomically within the acetabular portion of the pelvis.      At this point, the femur was rolled at 80 degrees.  Further capsule was   released off the inferior aspect of the femoral neck.  I then   released the superior capsule proximally.  The hook was placed laterally   along the femur and elevated manually and held in position with the bed   hook.  The leg was then extended and adducted with the leg rolled to 100   degrees of external rotation.  Once the proximal femur was fully   exposed, I used a box osteotome to set orientation.  I then began   broaching with the starting chili pepper broach and passed this by hand and then broached up to 5.  With the 5 broach in place I chose a high offset neck and did a trial reduction.  The offset was appropriate, leg lengths   appeared to be equal, confirmed radiographically.   Given these findings, I went ahead and dislocated the hip, repositioned all   retractors and positioned the right hip in the extended and abducted position.  The final 5 Hi Tri Lock stem was   chosen and it was impacted down to the level of neck cut.  Based on this   and the trial reduction, a 36+5 metal ball was chosen and   impacted onto a clean and dry trunnion, and the hip was reduced.  The   hip had been irrigated throughout the case again at this point.  I did   reapproximate the superior capsular leaflet to the anterior leaflet   using #1 Vicryl, placed a medium Hemovac drain deep.  The fascia of the   tensor fascia lata muscle was then reapproximated using #1 Vicryl.  The   remaining wound was closed with 2-0 Vicryl and running 4-0 Monocryl.   The hip was cleaned, dried, and dressed sterilely using Dermabond and   Aquacel dressing.  Drain site dressed  separately.  She was then brought   to  recovery room in stable condition tolerating the procedure well.    Lanney Gins, PA-C was present for the entirety of the case involved from   preoperative positioning, perioperative retractor management, general   facilitation of the case, as well as primary wound closure as assistant.            Madlyn Frankel Charlann Boxer, M.D.            MDO/MEDQ  D:  05/05/2011  T:  05/05/2011  Job:  [id number]      Electronically Signed by Durene Romans M.D. on 05/11/2011 09:15:38 AM

## 2012-03-08 NOTE — Interval H&P Note (Signed)
History and Physical Interval Note:  03/08/2012 10:27 AM  [name]  has presented today for surgery, with the diagnosis of Left hip Osteoarthritis  The various methods of treatment have been discussed with the patient and family. After consideration of risks, benefits and other options for treatment, the patient has consented to  Procedure(s) (LRB): LEFT TOTAL HIP ARTHROPLASTY ANTERIOR APPROACH (Left) as a surgical intervention .  The patient's history has been reviewed, patient examined, no change in status, stable for surgery.  I have reviewed the patient's chart and labs.  Questions were answered to the patient's satisfaction.     Shelda Pal

## 2012-03-08 NOTE — Anesthesia Preprocedure Evaluation (Addendum)
Anesthesia Evaluation  Patient identified by MRN, date of birth, ID band Patient awake    Reviewed: Allergy & Precautions, H&P , NPO status , Patient's Chart, lab work & pertinent test results  Airway Mallampati: II TM Distance: >3 FB Neck ROM: Full    Dental  (+) Teeth Intact and Dental Advisory Given   Pulmonary neg pulmonary ROS,  breath sounds clear to auscultation  Pulmonary exam normal       Cardiovascular negative cardio ROS  Rhythm:Regular Rate:Normal     Neuro/Psych Anxiety CVA negative psych ROS   GI/Hepatic negative GI ROS, Neg liver ROS,   Endo/Other  negative endocrine ROS  Renal/GU negative Renal ROS  negative genitourinary   Musculoskeletal negative musculoskeletal ROS (+)   Abdominal   Peds negative pediatric ROS (+)  Hematology negative hematology ROS (+)   Anesthesia Other Findings   Reproductive/Obstetrics negative OB ROS                          Anesthesia Physical Anesthesia Plan  ASA: I  Anesthesia Plan: General   Post-op Pain Management:    Induction: Intravenous  Airway Management Planned: Oral ETT  Additional Equipment:   Intra-op Plan:   Post-operative Plan: Extubation in OR  Informed Consent: I have reviewed the patients History and Physical, chart, labs and discussed the procedure including the risks, benefits and alternatives for the proposed anesthesia with the patient or authorized representative who has indicated his/her understanding and acceptance.   Dental advisory given  Plan Discussed with: CRNA  Anesthesia Plan Comments:         Anesthesia Quick Evaluation

## 2012-03-09 DIAGNOSIS — D62 Acute posthemorrhagic anemia: Secondary | ICD-10-CM

## 2012-03-09 LAB — POCT I-STAT 4, (NA,K, GLUC, HGB,HCT)
Hemoglobin: 11.2 g/dL — ABNORMAL LOW (ref 12.0–15.0)
Sodium: 139 mEq/L (ref 135–145)

## 2012-03-09 LAB — BASIC METABOLIC PANEL
CO2: 24 mEq/L (ref 19–32)
Calcium: 8 mg/dL — ABNORMAL LOW (ref 8.4–10.5)
Creatinine, Ser: 0.9 mg/dL (ref 0.50–1.10)
GFR calc non Af Amer: 56 mL/min — ABNORMAL LOW (ref >90)

## 2012-03-09 LAB — CBC
MCH: 30.6 pg (ref 26.0–34.0)
MCHC: 33.8 g/dL (ref 30.0–36.0)
MCV: 90.7 fL (ref 78.0–100.0)
Platelets: 196 10*3/uL (ref 150–400)
RBC: 2.48 MIL/uL — ABNORMAL LOW (ref 3.87–5.11)
RDW: 13.8 % (ref 11.5–15.5)

## 2012-03-09 LAB — PREPARE RBC (CROSSMATCH)

## 2012-03-09 MED ORDER — FUROSEMIDE 10 MG/ML IJ SOLN
10.0000 mg | Freq: Once | INTRAMUSCULAR | Status: AC
  Administered 2012-03-09: 10 mg via INTRAVENOUS
  Filled 2012-03-09: qty 1

## 2012-03-09 NOTE — Progress Notes (Signed)
Clinical Social Work Department BRIEF PSYCHOSOCIAL ASSESSMENT 03/09/2012  Patient:  FRANCOISE, CHOJNOWSKI     Account Number:  [id number]     Admit date:  03/08/2012  Clinical Social Worker:  Candie Chroman  Date/Time:  03/09/2012 01:02 PM  Referred by:  Physician  Date Referred:  03/09/2012 Referred for  SNF Placement   Other Referral:   Interview type:  Patient Other interview type:    PSYCHOSOCIAL DATA Living Status:  FACILITY Admitted from facility:  [facility] Level of care:  Independent Living Primary support name:  Jari Favre Primary support relationship to patient:  CHILD, ADULT Degree of support available:   unclear    CURRENT CONCERNS Current Concerns  Post-Acute Placement   Other Concerns:    SOCIAL WORK ASSESSMENT / PLAN Pt is an 78 yr old female living at [facility] prior to hospitalization. CSW met with pt yo assist with d/c planning. Pt plans to return to [facility] ( rehab unit ) when stable for d/c. Facility contacted and d/c plan confirmed. CSW will follow to assist with d/c planning to rehab.   Assessment/plan status:  Psychosocial Support/Ongoing Assessment of Needs Other assessment/ plan:   Information/referral to community resources:   none needed at this time    PATIENT'S/FAMILY'S RESPONSE TO PLAN OF CARE: Pt looking forward to returning to [facility].   Cori Razor LCSW [phone]

## 2012-03-09 NOTE — Progress Notes (Signed)
   Subjective: 1 Day Post-Op Procedure(s) (LRB): TOTAL HIP ARTHROPLASTY ANTERIOR APPROACH (Left)   Patient reports pain as mild, pain well controlled. Patient complaining of being a little weak. Otherwise no events throughout the night.  Objective:   VITALS:   Filed Vitals:   03/09/12 1110  BP: 76/43  Pulse: 78  Temp: 97.4 F (36.3 C)  Resp: 19    Neurovascular intact Dorsiflexion/Plantar flexion intact Incision: dressing C/D/I No cellulitis present Compartment soft  LABS  Basename 03/09/12 0352 03/08/12 1340  HGB 7.6* 11.2*  HCT 22.5* 33.0*  WBC 8.5 --  PLT 196 --     Basename 03/09/12 0352 03/08/12 1340  NA 135 139  K 4.9 3.9  BUN 22 --  CREATININE 0.90 --  GLUCOSE 162* 111*     Assessment/Plan: 1 Day Post-Op Procedure(s) (LRB): TOTAL HIP ARTHROPLASTY ANTERIOR APPROACH (Left) HV drain d/c'ed Foley cath d/c'ed Advance diet Up with therapy D/C IV fluids Discharge to SNF eventually, when ready ([facility])   ABLA Patient will be transfused with 2 units of blood    Anastasio Auerbach. Kimblery Diop   PAC  03/09/2012, 11:14 AM

## 2012-03-09 NOTE — Plan of Care (Signed)
Problem: Consults Goal: Diagnosis- Total Joint Replacement Outcome: Completed/Met Date Met:  03/09/12 Primary Total Hip  Left Hip

## 2012-03-09 NOTE — Progress Notes (Signed)
Physical Therapy Treatment Patient Details Name: [name] MRN: [id number] DOB: 06-Jan-1924 Today's Date: 03/09/2012 Time: 4098-1191 PT Time Calculation (min): 21 min  PT Assessment / Plan / Recommendation Comments on Treatment Session  Pt progressing with ambulation and exercises, however requires max cuing to step with LLE first.     Follow Up Recommendations  Skilled nursing facility    Barriers to Discharge        Equipment Recommendations  Defer to next venue    Recommendations for Other Services OT consult  Frequency 7X/week   Plan Discharge plan remains appropriate    Precautions / Restrictions Precautions Precautions: None Restrictions Weight Bearing Restrictions: Yes LLE Weight Bearing: Partial weight bearing LLE Partial Weight Bearing Percentage or Pounds: 50%   Pertinent Vitals/Pain 4/10    Mobility  Bed Mobility Bed Mobility: Sit to Supine Sit to Supine: 4: Min assist Details for Bed Mobility Assistance: Assist for LLE into bed with min cues for hip positioning once in bed.  Transfers Transfers: Sit to Stand;Stand to Sit Sit to Stand: 4: Min assist;With upper extremity assist;With armrests;From chair/3-in-1 Stand to Sit: 4: Min assist;With upper extremity assist;To bed Details for Transfer Assistance: Min cues for hand placement and safety when sitting/standing.  Ambulation/Gait Ambulation/Gait Assistance: 4: Min assist Ambulation Distance (Feet): 50 Feet Assistive device: Rolling walker Ambulation/Gait Assistance Details: Doing much better this afternoon with maintaining position inside of RW, however continues to require max cues for sequencing/technique with stepping.  Gait Pattern: Step-to pattern;Step-through pattern;Decreased stride length;Trunk flexed Gait velocity: decreased Stairs: No Wheelchair Mobility Wheelchair Mobility: No    Exercises Total Joint Exercises Ankle Circles/Pumps: AROM;Both;20 reps Quad Sets: AROM;Left;10 reps Short Arc  Quad: AROM;Left;10 reps Heel Slides: AAROM;Left;10 reps Hip ABduction/ADduction: AAROM;Left;10 reps   PT Diagnosis:    PT Problem List:   PT Treatment Interventions:     PT Goals Acute Rehab PT Goals PT Goal Formulation: With patient Time For Goal Achievement: 03/12/12 Potential to Achieve Goals: Good Pt will go Sit to Supine/Side: with supervision PT Goal: Sit to Supine/Side - Progress: Progressing toward goal Pt will go Sit to Stand: with supervision PT Goal: Sit to Stand - Progress: Progressing toward goal Pt will Ambulate: 51 - 150 feet;with supervision;with least restrictive assistive device PT Goal: Ambulate - Progress: Progressing toward goal Pt will Perform Home Exercise Program: with supervision, verbal cues required/provided PT Goal: Perform Home Exercise Program - Progress: Progressing toward goal  Visit Information  Last PT Received On: 03/09/12 Assistance Needed: +1    Subjective Data  Subjective: I want to walk. Patient Stated Goal: to go to rehab   Cognition  Overall Cognitive Status: Appears within functional limits for tasks assessed/performed Arousal/Alertness: Awake/alert Orientation Level: Appears intact for tasks assessed Behavior During Session: [facility] for tasks performed    Balance     End of Session PT - End of Session Activity Tolerance: Patient tolerated treatment well Patient left: in bed;with call bell/phone within reach Nurse Communication: Mobility status   GP     Page, Meribeth Mattes 03/09/2012, 4:58 PM

## 2012-03-09 NOTE — Evaluation (Signed)
Physical Therapy Evaluation Patient Details Name: [name] MRN: [id number] DOB: Feb 25, 1924 Today's Date: 03/09/2012 Time: 4098-1191 PT Time Calculation (min): 16 min  PT Assessment / Plan / Recommendation Clinical Impression  Pt presents s/p L THA (direct anterior) POD 1 with decreased strength, ROM and mobility.  Did well with mobility and ambulation, however with very forward flexed posture and tendency to let RW get too far ahead of her.  This is most likely due to fact that pt used rollator PTA.  Pt will benefit from skilled PT in acute venue to address deficits.  PT recommends short term SNF for follow up therapy at D/C to increase pt safety.      PT Assessment  Patient needs continued PT services    Follow Up Recommendations  Skilled nursing facility    Barriers to Discharge Decreased caregiver support      Equipment Recommendations  Defer to next venue    Recommendations for Other Services OT consult   Frequency 7X/week    Precautions / Restrictions Precautions Precautions: None Restrictions Weight Bearing Restrictions: Yes LLE Weight Bearing: Partial weight bearing LLE Partial Weight Bearing Percentage or Pounds: 50%   Pertinent Vitals/Pain 0/10      Mobility  Bed Mobility Bed Mobility: Supine to Sit Supine to Sit: 4: Min assist;HOB elevated Details for Bed Mobility Assistance: Assist for LLE out of bed with cues for hand placement to self assist.  Transfers Transfers: Sit to Stand;Stand to Sit Sit to Stand: 4: Min assist;From elevated surface;With upper extremity assist;From bed Stand to Sit: 4: Min assist;With upper extremity assist;With armrests;To chair/3-in-1 Details for Transfer Assistance: Cues for hand placement and safety when sitting/standing.  Pt somewhat impulsive with mobility.  Ambulation/Gait Ambulation/Gait Assistance: 4: Min assist Ambulation Distance (Feet): 80 Feet Assistive device: Rolling walker Ambulation/Gait Assistance Details:  Assist to steady pt throughout ambulation and for RW placement.  Max cuing for sequencing/technique with RW, maintaining position inside of RW due to tendency to let RW get too far ahead of her.  Pt normally ambulates with rollator which has probably lead to foward flexed posture with ambulation.  Gait Pattern: Step-to pattern;Step-through pattern;Decreased stride length;Trunk flexed Gait velocity: decreased Stairs: No Wheelchair Mobility Wheelchair Mobility: No    Exercises     PT Diagnosis: Difficulty walking;Generalized weakness;Acute pain  PT Problem List: Decreased strength;Decreased range of motion;Decreased activity tolerance;Decreased balance;Decreased mobility;Decreased knowledge of use of DME;Decreased safety awareness;Pain PT Treatment Interventions: DME instruction;Gait training;Functional mobility training;Therapeutic activities;Therapeutic exercise;Balance training;Patient/family education   PT Goals Acute Rehab PT Goals PT Goal Formulation: With patient Time For Goal Achievement: 03/12/12 Potential to Achieve Goals: Good Pt will go Supine/Side to Sit: with supervision PT Goal: Supine/Side to Sit - Progress: Goal set today Pt will go Sit to Supine/Side: with supervision PT Goal: Sit to Supine/Side - Progress: Goal set today Pt will go Sit to Stand: with supervision PT Goal: Sit to Stand - Progress: Goal set today Pt will Ambulate: 51 - 150 feet;with supervision;with least restrictive assistive device PT Goal: Ambulate - Progress: Goal set today Pt will Perform Home Exercise Program: with supervision, verbal cues required/provided PT Goal: Perform Home Exercise Program - Progress: Goal set today  Visit Information  Last PT Received On: 03/09/12 Assistance Needed: +1    Subjective Data  Subjective: Lets do it Patient Stated Goal: to go to rehab   Prior Functioning  Home Living Lives With: Alone Available Help at Discharge: Skilled Nursing Facility Type of Home:  Independent living facility  Home Access: Level entry Home Layout: One level Home Adaptive Equipment: Environmental consultant - four wheeled Prior Function Level of Independence: Independent Vocation: Retired Musician: No difficulties    Cognition  Overall Cognitive Status: Appears within functional limits for tasks assessed/performed Arousal/Alertness: Awake/alert Orientation Level: Appears intact for tasks assessed Behavior During Session: [facility] for tasks performed    Extremity/Trunk Assessment Right Lower Extremity Assessment RLE ROM/Strength/Tone: WFL for tasks assessed RLE Sensation: WFL - Light Touch RLE Coordination: WFL - gross motor Left Lower Extremity Assessment LLE ROM/Strength/Tone: Deficits LLE ROM/Strength/Tone Deficits: knee flex approx 70 deg, can perform hip abd with some assist from therapist.  LLE Sensation: WFL - Light Touch Trunk Assessment Trunk Assessment: Kyphotic (forward flexed posture)   Balance    End of Session PT - End of Session Equipment Utilized During Treatment: Gait belt Activity Tolerance: Patient tolerated treatment well Patient left: in chair;with call bell/phone within reach Nurse Communication: Mobility status  GP     Page, Meribeth Mattes 03/09/2012, 10:21 AM

## 2012-03-10 ENCOUNTER — Encounter (HOSPITAL_COMMUNITY): Payer: Self-pay | Admitting: Orthopedic Surgery

## 2012-03-10 LAB — TYPE AND SCREEN
ABO/RH(D): O POS
Antibody Screen: NEGATIVE
Unit division: 0
Unit division: 0

## 2012-03-10 LAB — BASIC METABOLIC PANEL
CO2: 24 mEq/L (ref 19–32)
Glucose, Bld: 120 mg/dL — ABNORMAL HIGH (ref 70–99)
Potassium: 4.1 mEq/L (ref 3.5–5.1)
Sodium: 137 mEq/L (ref 135–145)

## 2012-03-10 LAB — CBC
HCT: 25.2 % — ABNORMAL LOW (ref 36.0–46.0)
Hemoglobin: 8.6 g/dL — ABNORMAL LOW (ref 12.0–15.0)
MCH: 30.5 pg (ref 26.0–34.0)
RBC: 2.82 MIL/uL — ABNORMAL LOW (ref 3.87–5.11)

## 2012-03-10 MED ORDER — RIVAROXABAN 10 MG PO TABS: 10.0000 mg | ORAL_TABLET | ORAL | Status: DC

## 2012-03-10 MED ORDER — POLYETHYLENE GLYCOL 3350 17 G PO PACK: 17.0000 g | PACK | Freq: Two times a day (BID) | ORAL | Status: AC

## 2012-03-10 MED ORDER — HYDROCODONE-ACETAMINOPHEN 7.5-325 MG PO TABS: 1.0000 | ORAL_TABLET | ORAL | Status: AC | PRN

## 2012-03-10 MED ORDER — FERROUS SULFATE 325 (65 FE) MG PO TABS: 325.0000 mg | ORAL_TABLET | Freq: Three times a day (TID) | ORAL | Status: DC

## 2012-03-10 MED ORDER — DIPHENHYDRAMINE HCL 25 MG PO CAPS: 25.0000 mg | ORAL_CAPSULE | Freq: Four times a day (QID) | ORAL | Status: DC | PRN

## 2012-03-10 MED ORDER — ASPIRIN EC 325 MG PO TBEC: 325.0000 mg | DELAYED_RELEASE_TABLET | Freq: Two times a day (BID) | ORAL | Status: AC

## 2012-03-10 MED ORDER — METHOCARBAMOL 500 MG PO TABS: 500.0000 mg | ORAL_TABLET | Freq: Four times a day (QID) | ORAL | Status: AC | PRN

## 2012-03-10 MED ORDER — DSS 100 MG PO CAPS: 100.0000 mg | ORAL_CAPSULE | Freq: Two times a day (BID) | ORAL | Status: AC

## 2012-03-10 NOTE — Discharge Summary (Signed)
Physician Discharge Summary  Patient ID: [name] MRN: [id number] DOB/AGE: 76/31/1925 76 y.o.  Admit date: 03/08/2012 Discharge date:  03/11/2012   Procedures:  Procedure(s) (LRB): TOTAL HIP ARTHROPLASTY ANTERIOR APPROACH (Left)  Attending Physician:  Dr. Durene Romans   Admission Diagnoses:   Left hip OA and pain  Discharge Diagnoses:  Principal Problem:  *S/P left THA, AA Active Problems:  Acute blood loss anemia Anxiety   Stroke - hx of ministroke in 2011   Arthritis   Sinus drainage   Constipation   HPI: Pt is a 78 y.o. female complaining of left hip pain for 1+ years. Pain had continually increased since the beginning. X-rays in the clinic show end-stage arthritic changes of the left hip. Pt has tried various conservative treatments which have failed to alleviate their symptoms, including intraarticular and lateral steroid injections. Various options are discussed with the patient. Risks, benefits and expectations were discussed with the patient. Patient understand the risks, benefits and expectations and wishes to proceed with surgery.   PCP: No primary provider on file.   Discharged Condition: good  Hospital Course:  Patient underwent the above stated procedure on 03/08/2012. Patient tolerated the procedure well and brought to the recovery room in good condition and subsequently to the floor.  POD #1 BP: 76/43 ; Pulse: 78 ; Temp: 97.4 F (36.3 C) ; Resp: 19  Pt's foley was removed, as well as the hemovac drain removed. IV was changed to a saline lock. Patient reports pain as mild, pain well controlled. Patient complaining of being a little weak. Otherwise no events throughout the night. Neurovascular intact, dorsiflexion/plantar flexion intact, incision: dressing C/D/I, no cellulitis present and compartment soft.   LABS  Basename  03/09/12 0352   HGB  7.6  HCT  22.5   POD #2  BP: 111/65 ; Pulse: 107 ; Temp: 99 F (37.2 C) ; Resp: 18  Patient reports pain as  mild, pain well controlled. No events throughout the night.  Neurovascular intact, dorsiflexion/plantar flexion intact, incision: dressing C/D/I, no cellulitis present and compartment soft.   LABS  Basename  03/10/12 0426   HGB  8.6  HCT  25.2   POD #3  BP: 107/65 ; Pulse: 102 ; Temp: 98 F (36.7 C) ; Resp: 18  Patient reports pain as mild, pain well controlled. No events throughout the night. States that she feels much better this morning as compared to yesterday. Ready to be discharged to SNF. Neurovascular intact, dorsiflexion/plantar flexion intact, incision: dressing C/D/I, no cellulitis present and compartment soft.   LABS              No new labs  Discharge Exam: General appearance: alert, cooperative and no distress Extremities: Homans sign is negative, no sign of DVT, no edema, redness or tenderness in the calves or thighs and no ulcers, gangrene or trophic changes  Disposition:  SNF  with follow up in 2 weeks   Follow-up Information    Follow up with Shelda Pal, MD in 2 weeks.   Contact information:   [facility] [address] [place] [address] [phone]          Discharge Orders    Future Orders Please Complete By Expires   Diet - low sodium heart healthy      Call MD / Call 911      Comments:   If you experience chest pain or shortness of breath, CALL 911 and be transported to  the hospital emergency room.  If you develope a fever above 101 F, pus (white drainage) or increased drainage or redness at the wound, or calf pain, call your surgeon's office.   Discharge instructions      Comments:   Daily dressing changes with 4x4 gauze and tape. Keep the area dry and clean until follow up. Follow up in 2 weeks at [facility]. Call with any questions or concerns.   Constipation Prevention      Comments:   Drink plenty of fluids.  Prune juice may be helpful.  You may use a stool softener, such as Colace  (over the counter) 100 mg twice a day.  Use MiraLax (over the counter) for constipation as needed.   Increase activity slowly as tolerated      Change dressing      Comments:   Daily dressing changes with 4x4 guaze and tape. Keep the area dry and clean.   TED hose      Comments:   Use stockings (TED hose) for 2 weeks on both leg(s).  You may remove them at night for sleeping.      Current Discharge Medication List    START taking these medications   Details  aspirin EC 325 MG tablet Take 1 tablet (325 mg total) by mouth 2 (two) times daily. X 4 weeks Qty: 60 tablet, Refills: 0   Comments: Start the day after finishing the Xarelto    diphenhydrAMINE (BENADRYL) 25 mg capsule Take 1 capsule (25 mg total) by mouth every 6 (six) hours as needed for itching, allergies or sleep. Qty: 30 capsule    docusate sodium 100 MG CAPS Take 100 mg by mouth 2 (two) times daily. Qty: 10 capsule    ferrous sulfate 325 (65 FE) MG tablet Take 1 tablet (325 mg total) by mouth 3 (three) times daily after meals.    HYDROcodone-acetaminophen (NORCO) 7.5-325 MG per tablet Take 1-2 tablets by mouth every 4 (four) hours as needed for pain. Qty: 120 tablet, Refills: 0    methocarbamol (ROBAXIN) 500 MG tablet Take 1 tablet (500 mg total) by mouth every 6 (six) hours as needed (muscle spasms). Qty: 50 tablet, Refills: 0    polyethylene glycol (MIRALAX / GLYCOLAX) packet Take 17 g by mouth 2 (two) times daily. Qty: 14 each    rivaroxaban (XARELTO) 10 MG TABS tablet Take 1 tablet (10 mg total) by mouth daily. Qty: 12 tablet, Refills: 0      CONTINUE these medications which have NOT CHANGED   Details  ALPRAZolam (XANAX) 0.5 MG tablet Take 0.25 mg by mouth daily as needed. Anxiety    Cholecalciferol (VITAMIN D) 2000 UNITS tablet Take 2,000 Units by mouth every morning.    loratadine (CLARITIN) 10 MG tablet Take 10 mg by mouth every morning.      STOP taking these medications     aspirin 81 MG tablet  Comments:  Reason for Stopping:           Signed: Anastasio Auerbach. Nori Winegar   PAC  03/10/2012, 10:00 AM

## 2012-03-10 NOTE — Progress Notes (Signed)
Physical Therapy Treatment Note   03/10/12 1500  PT Visit Information  Last PT Received On 03/10/12  Assistance Needed +1  PT Time Calculation  PT Start Time 1448  PT Stop Time 1503  PT Time Calculation (min) 15 min  Subjective Data  Subjective Thank you for your help.  Precautions  Precautions None  Restrictions  Weight Bearing Restrictions Yes  LLE Weight Bearing PWB  LLE Partial Weight Bearing Percentage or Pounds 50%  Cognition  Overall Cognitive Status Appears within functional limits for tasks assessed/performed  Bed Mobility  Sit to Supine 4: Min assist  Details for Bed Mobility Assistance assist for L LE onto bed, verbal cues for technique  Transfers  Transfers Sit to Stand;Stand to Sit  Sit to Stand 4: Min guard  Stand to Sit 4: Min guard  Details for Transfer Assistance verbal cues for hand placement  Ambulation/Gait  Ambulation/Gait Assistance 4: Min guard  Ambulation Distance (Feet) 80 Feet  Assistive device Rolling walker  Ambulation/Gait Assistance Details Pt states sequence verbally while ambulating at the beginning of gait, heavy reliance on UEs  Gait Pattern Step-through pattern;Decreased stance time - left;Trunk flexed  Gait velocity decreased  Total Joint Exercises  Gluteal Sets AROM;Both;10 reps  Short Arc Quad AROM;Left;10 reps  Long [employer] AROM;Strengthening;Left;10 reps;Seated  Marching in Standing AROM;Strengthening;Left;10 reps;Seated  PT - End of Session  Activity Tolerance Patient tolerated treatment well  Patient left in bed;with call bell/phone within reach  PT - Assessment/Plan  Comments on Treatment Session Pt still reports not feeling very well but would like to ambulate again and agreeable to exercises.  Pt did not wish to perform hip abduction specifically.  PT Plan Discharge plan remains appropriate  Follow Up Recommendations Skilled nursing facility  Equipment Recommended Defer to next venue  Acute Rehab PT Goals  PT Goal: Sit to  Supine/Side - Progress Progressing toward goal  PT Goal: Sit to Stand - Progress Progressing toward goal  PT Goal: Ambulate - Progress Progressing toward goal  PT Goal: Perform Home Exercise Program - Progress Progressing toward goal  PT General Charges  $$ ACUTE PT VISIT 1 Procedure  PT Treatments  $Gait Training 8-22 mins    Zenovia Jarred, PT Pager: [phone]

## 2012-03-10 NOTE — Care Management Note (Signed)
    Page 1 of 1   03/10/2012     12:38:02 PM   CARE MANAGEMENT NOTE 03/10/2012  Patient:  [name], [name]   Account Number:  [id number]  Date Initiated:  03/10/2012  Documentation initiated by:  Lanier Clam  Subjective/Objective Assessment:   ADMITTED W/OA L HIP.     Action/Plan:   FROM [facility].   Anticipated DC Date:  03/10/2012   Anticipated DC Plan:  SKILLED NURSING FACILITY  In-house referral  Clinical Social Worker      DC Planning Services  CM consult      Choice offered to / List presented to:             Status of service:  Completed, signed off Medicare Important Message given?   (If response is "NO", the following Medicare IM given date fields will be blank) Date Medicare IM given:   Date Additional Medicare IM given:    Discharge Disposition:  SKILLED NURSING FACILITY  Per UR Regulation:  Reviewed for med. necessity/level of care/duration of stay  If discussed at Long Length of Stay Meetings, dates discussed:    Comments:

## 2012-03-10 NOTE — Progress Notes (Signed)
Physical Therapy Treatment Patient Details Name: [name] MRN: [id number] DOB: Nov 22, 1923 Today's Date: 03/10/2012 Time: 4098-1191 PT Time Calculation (min): 9 min  PT Assessment / Plan / Recommendation Comments on Treatment Session  Pt concerned with nausea and feeling "backed up" in midchest (RN aware) so ambulated in hallway and then assisted pt to recliner.    Follow Up Recommendations  Skilled nursing facility    Barriers to Discharge        Equipment Recommendations  Defer to next venue    Recommendations for Other Services    Frequency     Plan Discharge plan remains appropriate    Precautions / Restrictions Precautions Precautions: None Restrictions Weight Bearing Restrictions: Yes LLE Weight Bearing: Partial weight bearing LLE Partial Weight Bearing Percentage or Pounds: 50%   Pertinent Vitals/Pain 2/10 L hip pain, more concerned with nausea, RN already assessed pt and provided meds    Mobility  Transfers Stand to Sit: 4: Min guard;To chair/3-in-1;With upper extremity assist Details for Transfer Assistance: pt at doorway with RN due to pt hopeful ambulation would help move GI problems along, verbal cues for hand placement to sit down Ambulation/Gait Ambulation/Gait Assistance: 4: Min guard Ambulation Distance (Feet): 80 Feet Assistive device: Rolling walker Ambulation/Gait Assistance Details: pt continues to ambulate with nonsurgical leg first however ambulating safely  Gait Pattern: Step-to pattern;Decreased step length - left Gait velocity: decreased    Exercises Total Joint Exercises Ankle Circles/Pumps: AROM;Both;20 reps Quad Sets: AROM;Both;10 reps   PT Diagnosis:    PT Problem List:   PT Treatment Interventions:     PT Goals Acute Rehab PT Goals PT Goal: Sit to Stand - Progress: Progressing toward goal PT Goal: Ambulate - Progress: Progressing toward goal PT Goal: Perform Home Exercise Program - Progress: Progressing toward goal  Visit  Information  Last PT Received On: 03/10/12 Assistance Needed: +1    Subjective Data  Subjective: The pain in my hip is fine, I'm more concerned with the nausea feeling.   Cognition  Overall Cognitive Status: Appears within functional limits for tasks assessed/performed    Balance     End of Session PT - End of Session Activity Tolerance: Patient tolerated treatment well Patient left: in chair;with call bell/phone within reach   GP     [facility] E 03/10/2012, 12:41 PM Pager: [phone]

## 2012-03-10 NOTE — Progress Notes (Signed)
Occupational Therapy Note Order noted. See plan for SNF. Defer OT eval to SNF. Judithann Sauger OTR/L [phone] 03/10/2012

## 2012-03-10 NOTE — Progress Notes (Signed)
   Subjective: 2 Days Post-Op Procedure(s) (LRB): TOTAL HIP ARTHROPLASTY ANTERIOR APPROACH (Left)   Patient reports pain as mild, pain well controlled. No events throughout the night. Ready to be discharged to SNF.  Objective:   VITALS:   Filed Vitals:   03/10/12 0600  BP: 111/65  Pulse: 107  Temp: 99 F (37.2 C)  Resp: 18    Neurovascular intact Dorsiflexion/Plantar flexion intact Incision: dressing C/D/I No cellulitis present Compartment soft  LABS  Basename 03/10/12 0426 03/09/12 0352 03/08/12 1340  HGB 8.6* 7.6* 11.2*  HCT 25.2* 22.5* 33.0*  WBC 9.0 8.5 --  PLT 161 196 --     Basename 03/10/12 0426 03/09/12 0352 03/08/12 1340  NA 137 135 139  K 4.1 4.9 3.9  BUN 34* 22 --  CREATININE 1.31* 0.90 --  GLUCOSE 120* 162* 111*     Assessment/Plan: 2 Days Post-Op Procedure(s) (LRB): TOTAL HIP ARTHROPLASTY ANTERIOR APPROACH (Left)   Up with therapy Discharge to SNF Follow up in 2 weeks at [facility].  Follow-up Information    Follow up with OLIN,Ladanian Kelter D in 2 weeks.   Contact information:   [facility] [address], [address] [place] [address] [phone]           Anastasio Auerbach. Nola Botkins   PAC  03/10/2012, 9:52 AM

## 2012-03-11 DIAGNOSIS — M1990 Unspecified osteoarthritis, unspecified site: Secondary | ICD-10-CM | POA: Diagnosis not present

## 2012-03-11 DIAGNOSIS — D62 Acute posthemorrhagic anemia: Secondary | ICD-10-CM | POA: Diagnosis not present

## 2012-03-11 DIAGNOSIS — K5900 Constipation, unspecified: Secondary | ICD-10-CM | POA: Diagnosis not present

## 2012-03-11 DIAGNOSIS — Z79899 Other long term (current) drug therapy: Secondary | ICD-10-CM | POA: Diagnosis not present

## 2012-03-11 DIAGNOSIS — R5381 Other malaise: Secondary | ICD-10-CM | POA: Diagnosis not present

## 2012-03-11 NOTE — Progress Notes (Signed)
Pt was d/c back to [facility] ( SNF Unit ) today . [facility] provided transportation. CSW offered to contact family to update them on d/c plan but pt declined assistance.   Cori Razor LCSW [phone]

## 2012-03-11 NOTE — Progress Notes (Signed)
Physical Therapy Treatment Patient Details Name: [name] MRN: [id number] DOB: 23-Nov-1923 Today's Date: 03/11/2012 Time: 1000-1015 PT Time Calculation (min): 15 min  PT Assessment / Plan / Recommendation Comments on Treatment Session  Pt reports feeling much better today and ambulated in hallway.  Pt feels ready for d/c back to [facility] today.    Follow Up Recommendations  Skilled nursing facility    Barriers to Discharge        Equipment Recommendations  Defer to next venue    Recommendations for Other Services    Frequency     Plan Discharge plan remains appropriate    Precautions / Restrictions Precautions Precautions: None Restrictions Weight Bearing Restrictions: Yes LLE Weight Bearing: Partial weight bearing LLE Partial Weight Bearing Percentage or Pounds: 50%   Pertinent Vitals/Pain No pain    Mobility  Bed Mobility Bed Mobility: Supine to Sit Supine to Sit: 4: Min assist;HOB elevated Details for Bed Mobility Assistance: assist for L LE, verbal cues for technique, HOB elevated and pt required use of trapeze bar Transfers Transfers: Sit to Stand;Stand to Sit Sit to Stand: 4: Min guard;With upper extremity assist;From bed Stand to Sit: 4: Min guard;With upper extremity assist;To chair/3-in-1 Details for Transfer Assistance: verbal cues for hand placement Ambulation/Gait Ambulation/Gait Assistance: 4: Min guard Ambulation Distance (Feet): 120 Feet Assistive device: Rolling walker Ambulation/Gait Assistance Details: pt able to better demonstrate appropriate sequence with L LE first today Gait Pattern: Step-through pattern;Decreased stance time - left;Trunk flexed Gait velocity: decreased    Exercises     PT Diagnosis:    PT Problem List:   PT Treatment Interventions:     PT Goals Acute Rehab PT Goals PT Goal: Supine/Side to Sit - Progress: Progressing toward goal PT Goal: Sit to Stand - Progress: Progressing toward goal PT Goal: Ambulate -  Progress: Progressing toward goal  Visit Information  Last PT Received On: 03/11/12 Assistance Needed: +1    Subjective Data  Subjective: I am feeling better today.   Cognition  Overall Cognitive Status: Appears within functional limits for tasks assessed/performed    Balance     End of Session PT - End of Session Activity Tolerance: Patient tolerated treatment well Patient left: in chair;with call bell/phone within reach   GP     [facility] E 03/11/2012, 10:44 AM Pager: [phone]

## 2012-03-11 NOTE — Progress Notes (Signed)
   Subjective: 3 Days Post-Op Procedure(s) (LRB): TOTAL HIP ARTHROPLASTY ANTERIOR APPROACH (Left)   Patient reports pain as mild, pain well controlled. No events throughout the night. States that she feels much better this morning as compared to yesterday. Ready to be discharged to SNF.  Objective:   VITALS:   Filed Vitals:   03/11/12 0524  BP: 107/65  Pulse: 102  Temp: 98 F (36.7 C)  Resp: 18    Neurovascular intact Dorsiflexion/Plantar flexion intact Incision: dressing C/D/I No cellulitis present Compartment soft  LABS  Basename 03/10/12 0426 03/09/12 0352 03/08/12 1340  HGB 8.6* 7.6* 11.2*  HCT 25.2* 22.5* 33.0*  WBC 9.0 8.5 --  PLT 161 196 --     Basename 03/10/12 0426 03/09/12 0352 03/08/12 1340  NA 137 135 139  K 4.1 4.9 3.9  BUN 34* 22 --  CREATININE 1.31* 0.90 --  GLUCOSE 120* 162* 111*     Assessment/Plan: 3 Days Post-Op Procedure(s) (LRB): TOTAL HIP ARTHROPLASTY ANTERIOR APPROACH (Left) Up with therapy Discharge to SNF today Follow up in 2 weeks at [facility].  Follow-up Information    Follow up with OLIN,Marten Iles D in 2 weeks.   Contact information:   [facility] [address] [place] [address] [phone]           ABLA  Treated with iron and will observe, stabilizing.   Anastasio Auerbach Shanea Karney   PAC  03/11/2012, 7:49 AM

## 2012-03-12 DIAGNOSIS — R269 Unspecified abnormalities of gait and mobility: Secondary | ICD-10-CM | POA: Diagnosis not present

## 2012-03-12 DIAGNOSIS — M25569 Pain in unspecified knee: Secondary | ICD-10-CM | POA: Diagnosis not present

## 2012-03-12 DIAGNOSIS — Z96649 Presence of unspecified artificial hip joint: Secondary | ICD-10-CM | POA: Diagnosis not present

## 2012-03-12 DIAGNOSIS — R279 Unspecified lack of coordination: Secondary | ICD-10-CM | POA: Diagnosis not present

## 2012-03-12 DIAGNOSIS — M6281 Muscle weakness (generalized): Secondary | ICD-10-CM | POA: Diagnosis not present

## 2012-03-12 DIAGNOSIS — Z471 Aftercare following joint replacement surgery: Secondary | ICD-10-CM | POA: Diagnosis not present

## 2012-03-13 DIAGNOSIS — R269 Unspecified abnormalities of gait and mobility: Secondary | ICD-10-CM | POA: Diagnosis not present

## 2012-03-13 DIAGNOSIS — M25569 Pain in unspecified knee: Secondary | ICD-10-CM | POA: Diagnosis not present

## 2012-03-13 DIAGNOSIS — R279 Unspecified lack of coordination: Secondary | ICD-10-CM | POA: Diagnosis not present

## 2012-03-13 DIAGNOSIS — Z471 Aftercare following joint replacement surgery: Secondary | ICD-10-CM | POA: Diagnosis not present

## 2012-03-13 DIAGNOSIS — M6281 Muscle weakness (generalized): Secondary | ICD-10-CM | POA: Diagnosis not present

## 2012-03-13 DIAGNOSIS — Z96649 Presence of unspecified artificial hip joint: Secondary | ICD-10-CM | POA: Diagnosis not present

## 2012-03-14 DIAGNOSIS — D62 Acute posthemorrhagic anemia: Secondary | ICD-10-CM | POA: Diagnosis not present

## 2012-03-14 DIAGNOSIS — K5900 Constipation, unspecified: Secondary | ICD-10-CM | POA: Diagnosis not present

## 2012-03-14 DIAGNOSIS — M1990 Unspecified osteoarthritis, unspecified site: Secondary | ICD-10-CM | POA: Diagnosis not present

## 2012-03-14 DIAGNOSIS — Z8673 Personal history of transient ischemic attack (TIA), and cerebral infarction without residual deficits: Secondary | ICD-10-CM | POA: Diagnosis not present

## 2012-03-14 DIAGNOSIS — R5381 Other malaise: Secondary | ICD-10-CM | POA: Diagnosis not present

## 2012-03-16 DIAGNOSIS — I6789 Other cerebrovascular disease: Secondary | ICD-10-CM | POA: Diagnosis not present

## 2012-03-16 DIAGNOSIS — E559 Vitamin D deficiency, unspecified: Secondary | ICD-10-CM | POA: Diagnosis not present

## 2012-03-17 DIAGNOSIS — K5900 Constipation, unspecified: Secondary | ICD-10-CM | POA: Diagnosis not present

## 2012-03-17 DIAGNOSIS — D62 Acute posthemorrhagic anemia: Secondary | ICD-10-CM | POA: Diagnosis not present

## 2012-03-17 DIAGNOSIS — R5381 Other malaise: Secondary | ICD-10-CM | POA: Diagnosis not present

## 2012-03-17 DIAGNOSIS — M1990 Unspecified osteoarthritis, unspecified site: Secondary | ICD-10-CM | POA: Diagnosis not present

## 2012-03-25 DIAGNOSIS — D62 Acute posthemorrhagic anemia: Secondary | ICD-10-CM | POA: Diagnosis not present

## 2012-03-25 DIAGNOSIS — K5900 Constipation, unspecified: Secondary | ICD-10-CM | POA: Diagnosis not present

## 2012-03-25 DIAGNOSIS — R5381 Other malaise: Secondary | ICD-10-CM | POA: Diagnosis not present

## 2012-03-25 DIAGNOSIS — M1990 Unspecified osteoarthritis, unspecified site: Secondary | ICD-10-CM | POA: Diagnosis not present

## 2012-03-25 DIAGNOSIS — Z79899 Other long term (current) drug therapy: Secondary | ICD-10-CM | POA: Diagnosis not present

## 2012-04-22 DIAGNOSIS — M169 Osteoarthritis of hip, unspecified: Secondary | ICD-10-CM | POA: Diagnosis not present

## 2012-04-26 DIAGNOSIS — Z23 Encounter for immunization: Secondary | ICD-10-CM | POA: Diagnosis not present

## 2012-06-03 DIAGNOSIS — Z96649 Presence of unspecified artificial hip joint: Secondary | ICD-10-CM | POA: Diagnosis not present

## 2012-08-29 DIAGNOSIS — H04129 Dry eye syndrome of unspecified lacrimal gland: Secondary | ICD-10-CM | POA: Diagnosis not present

## 2012-09-14 DIAGNOSIS — Z96649 Presence of unspecified artificial hip joint: Secondary | ICD-10-CM | POA: Diagnosis not present

## 2012-12-07 DIAGNOSIS — Z961 Presence of intraocular lens: Secondary | ICD-10-CM | POA: Diagnosis not present

## 2012-12-12 DIAGNOSIS — F411 Generalized anxiety disorder: Secondary | ICD-10-CM | POA: Diagnosis not present

## 2012-12-12 DIAGNOSIS — Z96649 Presence of unspecified artificial hip joint: Secondary | ICD-10-CM | POA: Diagnosis not present

## 2012-12-12 DIAGNOSIS — M810 Age-related osteoporosis without current pathological fracture: Secondary | ICD-10-CM | POA: Diagnosis not present

## 2012-12-12 DIAGNOSIS — Z8673 Personal history of transient ischemic attack (TIA), and cerebral infarction without residual deficits: Secondary | ICD-10-CM | POA: Diagnosis not present

## 2012-12-12 DIAGNOSIS — E559 Vitamin D deficiency, unspecified: Secondary | ICD-10-CM | POA: Diagnosis not present

## 2012-12-12 DIAGNOSIS — J309 Allergic rhinitis, unspecified: Secondary | ICD-10-CM | POA: Diagnosis not present

## 2012-12-12 DIAGNOSIS — E782 Mixed hyperlipidemia: Secondary | ICD-10-CM | POA: Diagnosis not present

## 2013-05-02 DIAGNOSIS — Z23 Encounter for immunization: Secondary | ICD-10-CM | POA: Diagnosis not present

## 2013-06-13 DIAGNOSIS — M810 Age-related osteoporosis without current pathological fracture: Secondary | ICD-10-CM | POA: Diagnosis not present

## 2013-06-13 DIAGNOSIS — J309 Allergic rhinitis, unspecified: Secondary | ICD-10-CM | POA: Diagnosis not present

## 2013-06-13 DIAGNOSIS — E782 Mixed hyperlipidemia: Secondary | ICD-10-CM | POA: Diagnosis not present

## 2013-06-13 DIAGNOSIS — F411 Generalized anxiety disorder: Secondary | ICD-10-CM | POA: Diagnosis not present

## 2013-06-13 DIAGNOSIS — Z96649 Presence of unspecified artificial hip joint: Secondary | ICD-10-CM | POA: Diagnosis not present

## 2013-06-13 DIAGNOSIS — Z1331 Encounter for screening for depression: Secondary | ICD-10-CM | POA: Diagnosis not present

## 2013-06-13 DIAGNOSIS — Z8673 Personal history of transient ischemic attack (TIA), and cerebral infarction without residual deficits: Secondary | ICD-10-CM | POA: Diagnosis not present

## 2013-06-13 DIAGNOSIS — E559 Vitamin D deficiency, unspecified: Secondary | ICD-10-CM | POA: Diagnosis not present

## 2013-08-02 DIAGNOSIS — Q828 Other specified congenital malformations of skin: Secondary | ICD-10-CM | POA: Diagnosis not present

## 2013-08-02 DIAGNOSIS — L259 Unspecified contact dermatitis, unspecified cause: Secondary | ICD-10-CM | POA: Diagnosis not present

## 2013-08-02 DIAGNOSIS — L821 Other seborrheic keratosis: Secondary | ICD-10-CM | POA: Diagnosis not present

## 2013-08-02 DIAGNOSIS — L820 Inflamed seborrheic keratosis: Secondary | ICD-10-CM | POA: Diagnosis not present

## 2013-08-02 DIAGNOSIS — L723 Sebaceous cyst: Secondary | ICD-10-CM | POA: Diagnosis not present

## 2013-08-02 DIAGNOSIS — L219 Seborrheic dermatitis, unspecified: Secondary | ICD-10-CM | POA: Diagnosis not present

## 2013-12-18 DIAGNOSIS — J309 Allergic rhinitis, unspecified: Secondary | ICD-10-CM | POA: Diagnosis not present

## 2013-12-18 DIAGNOSIS — F411 Generalized anxiety disorder: Secondary | ICD-10-CM | POA: Diagnosis not present

## 2013-12-18 DIAGNOSIS — E559 Vitamin D deficiency, unspecified: Secondary | ICD-10-CM | POA: Diagnosis not present

## 2013-12-18 DIAGNOSIS — Z8673 Personal history of transient ischemic attack (TIA), and cerebral infarction without residual deficits: Secondary | ICD-10-CM | POA: Diagnosis not present

## 2013-12-18 DIAGNOSIS — M810 Age-related osteoporosis without current pathological fracture: Secondary | ICD-10-CM | POA: Diagnosis not present

## 2013-12-18 DIAGNOSIS — N183 Chronic kidney disease, stage 3 unspecified: Secondary | ICD-10-CM | POA: Diagnosis not present

## 2013-12-18 DIAGNOSIS — K5900 Constipation, unspecified: Secondary | ICD-10-CM | POA: Diagnosis not present

## 2013-12-18 DIAGNOSIS — E782 Mixed hyperlipidemia: Secondary | ICD-10-CM | POA: Diagnosis not present

## 2014-03-22 DIAGNOSIS — H04129 Dry eye syndrome of unspecified lacrimal gland: Secondary | ICD-10-CM | POA: Diagnosis not present

## 2014-03-22 DIAGNOSIS — Z961 Presence of intraocular lens: Secondary | ICD-10-CM | POA: Diagnosis not present

## 2014-03-22 DIAGNOSIS — H524 Presbyopia: Secondary | ICD-10-CM | POA: Diagnosis not present

## 2014-03-22 DIAGNOSIS — H02839 Dermatochalasis of unspecified eye, unspecified eyelid: Secondary | ICD-10-CM | POA: Diagnosis not present

## 2014-04-27 DIAGNOSIS — Z23 Encounter for immunization: Secondary | ICD-10-CM | POA: Diagnosis not present

## 2014-07-16 DIAGNOSIS — F419 Anxiety disorder, unspecified: Secondary | ICD-10-CM | POA: Diagnosis not present

## 2014-07-16 DIAGNOSIS — J309 Allergic rhinitis, unspecified: Secondary | ICD-10-CM | POA: Diagnosis not present

## 2014-07-16 DIAGNOSIS — K5900 Constipation, unspecified: Secondary | ICD-10-CM | POA: Diagnosis not present

## 2014-07-16 DIAGNOSIS — Z8673 Personal history of transient ischemic attack (TIA), and cerebral infarction without residual deficits: Secondary | ICD-10-CM | POA: Diagnosis not present

## 2014-07-16 DIAGNOSIS — E559 Vitamin D deficiency, unspecified: Secondary | ICD-10-CM | POA: Diagnosis not present

## 2014-07-16 DIAGNOSIS — M810 Age-related osteoporosis without current pathological fracture: Secondary | ICD-10-CM | POA: Diagnosis not present

## 2014-07-16 DIAGNOSIS — Z1389 Encounter for screening for other disorder: Secondary | ICD-10-CM | POA: Diagnosis not present

## 2014-07-16 DIAGNOSIS — N183 Chronic kidney disease, stage 3 (moderate): Secondary | ICD-10-CM | POA: Diagnosis not present

## 2014-07-16 DIAGNOSIS — E782 Mixed hyperlipidemia: Secondary | ICD-10-CM | POA: Diagnosis not present

## 2014-08-10 DIAGNOSIS — L578 Other skin changes due to chronic exposure to nonionizing radiation: Secondary | ICD-10-CM | POA: Diagnosis not present

## 2014-08-10 DIAGNOSIS — L820 Inflamed seborrheic keratosis: Secondary | ICD-10-CM | POA: Diagnosis not present

## 2014-08-10 DIAGNOSIS — L570 Actinic keratosis: Secondary | ICD-10-CM | POA: Diagnosis not present

## 2014-09-07 DIAGNOSIS — L578 Other skin changes due to chronic exposure to nonionizing radiation: Secondary | ICD-10-CM | POA: Diagnosis not present

## 2014-09-07 DIAGNOSIS — L570 Actinic keratosis: Secondary | ICD-10-CM | POA: Diagnosis not present

## 2014-10-26 ENCOUNTER — Ambulatory Visit (HOSPITAL_COMMUNITY)
Admission: RE | Admit: 2014-10-26 | Discharge: 2014-10-26 | Disposition: A | Discharge status: Home / Self Care | Payer: Medicare Other | Source: Ambulatory Visit | Attending: Family Medicine | Admitting: Family Medicine

## 2014-10-26 ENCOUNTER — Other Ambulatory Visit (HOSPITAL_COMMUNITY): Payer: Self-pay | Admitting: Family Medicine

## 2014-10-26 DIAGNOSIS — R2990 Unspecified symptoms and signs involving the nervous system: Secondary | ICD-10-CM | POA: Insufficient documentation

## 2014-10-26 DIAGNOSIS — R42 Dizziness and giddiness: Secondary | ICD-10-CM | POA: Diagnosis not present

## 2014-10-26 DIAGNOSIS — G311 Senile degeneration of brain, not elsewhere classified: Secondary | ICD-10-CM | POA: Diagnosis not present

## 2014-10-29 ENCOUNTER — Other Ambulatory Visit: Payer: Self-pay | Admitting: Family Medicine

## 2014-10-29 DIAGNOSIS — R29898 Other symptoms and signs involving the musculoskeletal system: Secondary | ICD-10-CM

## 2014-10-29 DIAGNOSIS — R42 Dizziness and giddiness: Secondary | ICD-10-CM

## 2014-10-29 DIAGNOSIS — R29810 Facial weakness: Secondary | ICD-10-CM

## 2014-10-30 ENCOUNTER — Ambulatory Visit
Admission: RE | Admit: 2014-10-30 | Discharge: 2014-10-30 | Disposition: A | Discharge status: Home / Self Care | Payer: Medicare Other | Source: Ambulatory Visit | Attending: Family Medicine | Admitting: Family Medicine

## 2014-10-30 DIAGNOSIS — I639 Cerebral infarction, unspecified: Secondary | ICD-10-CM | POA: Diagnosis not present

## 2014-10-30 DIAGNOSIS — R29898 Other symptoms and signs involving the musculoskeletal system: Secondary | ICD-10-CM

## 2014-10-30 DIAGNOSIS — R42 Dizziness and giddiness: Secondary | ICD-10-CM

## 2014-10-30 DIAGNOSIS — R29810 Facial weakness: Secondary | ICD-10-CM

## 2014-11-01 ENCOUNTER — Encounter: Payer: Self-pay | Admitting: Neurology

## 2014-11-01 ENCOUNTER — Ambulatory Visit (INDEPENDENT_AMBULATORY_CARE_PROVIDER_SITE_OTHER): Payer: Medicare Other | Admitting: Neurology

## 2014-11-01 VITALS — BP 146/86 | HR 70 | Resp 16 | Ht 63.5 in | Wt 129.0 lb

## 2014-11-01 DIAGNOSIS — I639 Cerebral infarction, unspecified: Secondary | ICD-10-CM | POA: Diagnosis not present

## 2014-11-01 DIAGNOSIS — R7989 Other specified abnormal findings of blood chemistry: Secondary | ICD-10-CM | POA: Insufficient documentation

## 2014-11-01 DIAGNOSIS — E7219 Other disorders of sulfur-bearing amino-acid metabolism: Secondary | ICD-10-CM | POA: Diagnosis not present

## 2014-11-01 DIAGNOSIS — I6350 Cerebral infarction due to unspecified occlusion or stenosis of unspecified cerebral artery: Secondary | ICD-10-CM | POA: Diagnosis not present

## 2014-11-01 DIAGNOSIS — E7211 Homocystinuria: Secondary | ICD-10-CM

## 2014-11-01 DIAGNOSIS — E7210 Disorders of sulfur-bearing amino-acid metabolism, unspecified: Secondary | ICD-10-CM | POA: Diagnosis not present

## 2014-11-01 MED ORDER — CLOPIDOGREL BISULFATE 75 MG PO TABS: 75.0000 mg | ORAL_TABLET | Freq: Every day | ORAL | Status: DC

## 2014-11-01 NOTE — Progress Notes (Signed)
[facility]  PATIENT: [name] DOB: [birth year]  REFERRING DOCTOR OR PCP:  Antony Contras SOURCE: patient, records and CT/MRI images  _________________________________   HISTORICAL  CHIEF COMPLAINT:  Chief Complaint  Patient presents with  . Cerebrovascular Accident    Sts. last Wednesday, upon waking that morning, she "just didn't feel quite right--something was off."  Sts. balance was off--she had to hold on to objects to steady herself.  The next day, she f/u with her pcp, Dr. Moreen Fowler, and he noticed left sided weakness, facial drooping.  CT head was neg., but mri showed cva.  She has been taking a baby asa daily for yrs.  Not on additional blood thinners.  Sts. still has some weakness in left leg/foot.  Grips seem equal bilat.  Smile still slightly uneven/fim    HISTORY OF PRESENT ILLNESS:  I had the pleasure of seeing your patient, [name], at [facility] for neurologic consultation regarding her recent stroke.  As you know, she is an 81 year old woman who awoke on 10/24/2014 is in the left leg, and unsteady gait and left facial droop. Later, she noted very mild left arm clumsiness.   When symptoms persisted she saw her primary care physician, Antony Contras, who ordered a CT scan. The CT scan of the head performed on 10/26/2014 showed extensive white matter microvascular disease in a pattern that was felt to be similar to what was noted on prior brain MRI from 07/01/2010. However, an MRI of the brain 10/30/2014 showed a large acute lacunar infarct involving the right corona radiata posterior limb of the right internal capsule without any hemorrhage or mass effect. I estimate reviewed the CT and MRI images and concur. She is now referred for further evaluation and treatment. Compared to last week, she feels she is doing better.   She has been using a walker all the time now but used to just use it for longer distances.     In December 2011,  she had a stroke when she woke up with right arm weakness.   She saw her Dr. Noberto Retort at [facility]. She had carotid Dopplers, Holter monitor and echocardiogram. The studies were reportedly unremarkable. By the time she was seen again in 3 weeks later, she had complete resolution of her right arm weakness.  Stroke risks:   She has no major risks.   She was never a smoker, she has not had hypertension or cholesterol issues in the past.      She has been on baby aspirin x many years and she bruises easily.   She has never been on aspirin.   Labs have been fine.    I see no records of ESR or homocysteine being checked in past.      REVIEW OF SYSTEMS: Constitutional: No fevers, chills, sweats, or change in appetite.   More fatigued. Eyes: No visual changes, double vision, eye pain Ear, nose and throat: No hearing loss, ear pain, sore throat.  Some nasal congestion,  Cardiovascular: No chest pain, palpitations Respiratory: No shortness of breath at rest or with exertion.   No wheezes GastrointestinaI: No nausea, vomiting, diarrhea, abdominal pain, fecal incontinence Genitourinary: No dysuria, urinary retention or frequency.  No nocturia. Musculoskeletal: No neck pain, back pain Integumentary: No rash, pruritus, skin lesions Neurological: as above Psychiatric: No depression at this time. Some  anxiety Endocrine: No palpitations, diaphoresis, change in appetite, change in weigh or increased thirst Hematologic/Lymphatic: No anemia, purpura, petechiae.  Bruises easily.  Allergic/Immunologic: No itchy/runny eyes, nasal congestion, recent allergic reactions, rashes  ALLERGIES: No Known Allergies  HOME MEDICATIONS:  Current outpatient prescriptions:  .  ALPRAZolam (XANAX) 0.5 MG tablet, Take 0.25 mg by mouth daily as needed. Anxiety, Disp: , Rfl:  .  Cholecalciferol (VITAMIN D) 2000 UNITS tablet, Take 2,000 Units by mouth every morning., Disp: , Rfl:  .  diphenhydrAMINE (BENADRYL)  25 mg capsule, Take 1 capsule (25 mg total) by mouth every 6 (six) hours as needed for itching, allergies or sleep., Disp: 30 capsule, Rfl:  .  ferrous sulfate 325 (65 FE) MG tablet, Take 1 tablet (325 mg total) by mouth 3 (three) times daily after meals., Disp: , Rfl:  .  loratadine (CLARITIN) 10 MG tablet, Take 10 mg by mouth every morning., Disp: , Rfl:   PAST MEDICAL HISTORY: Past Medical History  Diagnosis Date  . Anxiety   . Stroke     hx of ministroke in 2011   . Arthritis   . Sinus drainage   . Constipation   . Chronic kidney disease   . Movement disorder   . Vision abnormalities     PAST SURGICAL HISTORY: Past Surgical History  Procedure Laterality Date  . Joint replacement      right hip   . Tumor removed right breast       benign   . Eye surgery      bilateral cataract surgery   . Total hip arthroplasty  03/08/2012    Procedure: TOTAL HIP ARTHROPLASTY ANTERIOR APPROACH;  Surgeon: Mauri Pole, MD;  Location: [facility];  Service: Orthopedics;  Laterality: Left;    FAMILY HISTORY: Family History  Problem Relation Age of Onset  . Heart disease Mother   . Dementia Mother   . COPD Father     SOCIAL HISTORY:  History   Social History  . Marital Status: Widowed    Spouse Name: N/A  . Number of Children: N/A  . Years of Education: N/A   Occupational History  . Not on file.   Social History Main Topics  . Smoking status: Never Smoker   . Smokeless tobacco: Never Used  . Alcohol Use: Not on file  . Drug Use: No  . Sexual Activity: Not on file   Other Topics Concern  . Not on file   Social History Narrative     PHYSICAL EXAM  Filed Vitals:   11/01/14 1302  BP: 146/86  Pulse: 70  Resp: 16  Height: 5' 3.5" (1.613 m)  Weight: 129 lb (58.514 kg)    Body mass index is 22.49 kg/(m^2).   General: The patient is well-developed and well-nourished and in no acute distress  Eyes:  Sclerae anicteric  Neck: The neck is supple, no carotid bruits are  noted.  The neck is nontender.  Cardiovascular: The heart has a regular rate and rhythm with a normal S1 and S2. There were no murmurs, gallops or rubs. Lungs are clear to auscultation.  Skin: Extremities are without significant edema.   Neurologic Exam  Mental status: The patient is alert and oriented x 3 at the time of the examination. The patient has apparent normal recent and remote memory, with an apparently normal attention span and concentration ability.   Speech is normal.  Cranial nerves: Extraocular movements are full. Pupils are equal, round, and reactive to light and accomodation.  Visual fields are full.  Facial symmetry is present. There is good facial sensation to soft touch bilaterally.Facial strength is  normal.  Trapezius and sternocleidomastoid strength is normal. No dysarthria is noted.  The tongue is midline, and the patient has symmetric elevation of the soft palate. No obvious hearing deficits are noted.  Motor:  Muscle bulk is normal.   Tone is normal. Strength is  5 / 5 in all 4 extremities.   Sensory: Sensory testing is intact to pinprick, soft touch and vibration sensation in both arms and right leg but reduced vibratory sensation in left leg..  Coordination: Cerebellar testing reveals good finger-nose-finger bilaterally but reduced heel-to-shin on the left.  Gait and station: Station is normal.   Gait requires support.. Romberg is negative.   Reflexes: Deep tendon reflexes are symmetric and normal bilaterally in arms,  1 + at knees, absent in ankles.   Plantar responses are flexor.    DIAGNOSTIC DATA (LABS, IMAGING, TESTING) - I reviewed patient records, labs, notes, testing and imaging myself where available.  Lab Results  Component Value Date   WBC 9.0 03/10/2012   HGB 8.6* 03/10/2012   HCT 25.2* 03/10/2012   MCV 89.4 03/10/2012   PLT 161 03/10/2012      Component Value Date/Time   NA 137 03/10/2012 0426   K 4.1 03/10/2012 0426   CL 108 03/10/2012  0426   CO2 24 03/10/2012 0426   GLUCOSE 120* 03/10/2012 0426   BUN 34* 03/10/2012 0426   CREATININE 1.31* 03/10/2012 0426   CALCIUM 8.0* 03/10/2012 0426   GFRNONAA 36* 03/10/2012 0426   GFRAA 41* 03/10/2012 0426      ASSESSMENT AND PLAN  Acute CVA (cerebrovascular accident) - Plan: Sedimentation Rate, US Carotid Bilateral, Homocysteine  Elevated homocysteine - Plan: Homocysteine   In summary, [name] is an 81 year old woman with a history of stroke in 2011 who presents with a lacunar infarction causing her to have left leg decreased sensation and clumsiness. She believes that she has improved a little bit since last week but still needs to use a walker for her gait. I will see we can get physical therapy for her at her assisted living facility. She had a thorough stroke evaluation 5 years ago that was negative. I will recheck the carotid ultrasound to make sure that there is no source of emboli that could have led to her stroke. Additionally, I will check a homocysteine level and sedimentation rate to rule out correctable risk factors. I will switch her from aspirin to Plavix. If she notes that she bruises too much she can go back to the aspirin.  She will return as needed for new or worsening symptoms. She is advised to continue to be active in a safe manner.   Richard A. Felecia Shelling, MD, PhD 4/82/7078, 6:75 PM Certified in Neurology, Clinical Neurophysiology, Sleep Medicine, Pain Medicine and Neuroimaging  [facility] [address] [place], [place] [address] [phone]

## 2014-11-02 LAB — HOMOCYSTEINE: HOMOCYSTEINE: 22.2 umol/L — AB (ref 0.0–15.0)

## 2014-11-02 LAB — SEDIMENTATION RATE: SED RATE: 4 mm/h (ref 0–40)

## 2014-11-05 ENCOUNTER — Telehealth: Payer: Self-pay | Admitting: *Deleted

## 2014-11-05 MED ORDER — CEREFOLIN 6-1-50-5 MG PO TABS: 1.0000 | ORAL_TABLET | Freq: Every day | ORAL | Status: DC

## 2014-11-05 NOTE — Telephone Encounter (Signed)
-----   Message from Britt Bottom, MD sent at 11/05/2014 11:55 AM EDT ----- Homocysteine which can be a stroke risk factor is elevated.  pleae send in :    cerefolin  One tablet daily  #30   #11

## 2014-11-05 NOTE — Telephone Encounter (Signed)
Spoke with Bonnita Nasuti and per RAS, advised her of elevated homocysteine level, and that this puts her at increased risk for stroke.  She verbalized understanding of same, is agreeable to taking Cerefolin.  Rx. escribed to [facility] per her request/fim

## 2014-11-08 ENCOUNTER — Telehealth: Payer: Self-pay | Admitting: Radiology

## 2014-11-08 NOTE — Telephone Encounter (Signed)
Called home and left v/ message to return call for scheduling Carotid.

## 2014-11-09 NOTE — Telephone Encounter (Signed)
Patient returned call wanting to schedule Carotid. Please call and advise.

## 2014-11-12 NOTE — Telephone Encounter (Signed)
Patient called/returning Sue's call from 11/09/14. Patient can be reached @ [phone]

## 2014-11-22 ENCOUNTER — Ambulatory Visit (INDEPENDENT_AMBULATORY_CARE_PROVIDER_SITE_OTHER): Payer: Medicare Other

## 2014-11-22 DIAGNOSIS — I639 Cerebral infarction, unspecified: Secondary | ICD-10-CM | POA: Diagnosis not present

## 2014-11-30 ENCOUNTER — Telehealth: Payer: Self-pay | Admitting: Neurology

## 2014-11-30 NOTE — Telephone Encounter (Signed)
Pt called requesting the result of the  doppler study. Please call and advise. Pt can be reached at [phone].

## 2014-12-03 NOTE — Telephone Encounter (Signed)
The preliminary result is normal

## 2014-12-03 NOTE — Telephone Encounter (Signed)
I have spoken with [name] and per RAS, advised that preliminary carotid duplex result is normal.  She verbalized understanding of same/fim

## 2014-12-11 ENCOUNTER — Telehealth: Payer: Self-pay

## 2014-12-11 DIAGNOSIS — I639 Cerebral infarction, unspecified: Secondary | ICD-10-CM

## 2014-12-11 DIAGNOSIS — R260 Ataxic gait: Secondary | ICD-10-CM

## 2014-12-11 NOTE — Addendum Note (Signed)
Addended by: France Ravens I on: 12/11/2014 12:04 PM   Modules accepted: Orders

## 2014-12-11 NOTE — Telephone Encounter (Signed)
Rx. for pt faxed to [facility] fax# [phone]/VKP

## 2014-12-11 NOTE — Telephone Encounter (Signed)
[name] from [facility] called to say Dr Si Raider a referral for Physical Therapy for [name] and they need a official letterhead referral document for PT.  Please fax [phone] address it to [name] (Phone [phone])

## 2014-12-17 DIAGNOSIS — R2681 Unsteadiness on feet: Secondary | ICD-10-CM | POA: Diagnosis not present

## 2014-12-17 DIAGNOSIS — I639 Cerebral infarction, unspecified: Secondary | ICD-10-CM | POA: Diagnosis not present

## 2014-12-17 DIAGNOSIS — R260 Ataxic gait: Secondary | ICD-10-CM | POA: Diagnosis not present

## 2014-12-17 DIAGNOSIS — Z9181 History of falling: Secondary | ICD-10-CM | POA: Diagnosis not present

## 2014-12-19 DIAGNOSIS — R2681 Unsteadiness on feet: Secondary | ICD-10-CM | POA: Diagnosis not present

## 2014-12-19 DIAGNOSIS — Z9181 History of falling: Secondary | ICD-10-CM | POA: Diagnosis not present

## 2014-12-19 DIAGNOSIS — I639 Cerebral infarction, unspecified: Secondary | ICD-10-CM | POA: Diagnosis not present

## 2014-12-19 DIAGNOSIS — R260 Ataxic gait: Secondary | ICD-10-CM | POA: Diagnosis not present

## 2014-12-24 DIAGNOSIS — Z9181 History of falling: Secondary | ICD-10-CM | POA: Diagnosis not present

## 2014-12-24 DIAGNOSIS — I639 Cerebral infarction, unspecified: Secondary | ICD-10-CM | POA: Diagnosis not present

## 2014-12-24 DIAGNOSIS — R2681 Unsteadiness on feet: Secondary | ICD-10-CM | POA: Diagnosis not present

## 2014-12-24 DIAGNOSIS — R260 Ataxic gait: Secondary | ICD-10-CM | POA: Diagnosis not present

## 2014-12-26 DIAGNOSIS — R2681 Unsteadiness on feet: Secondary | ICD-10-CM | POA: Diagnosis not present

## 2014-12-26 DIAGNOSIS — I639 Cerebral infarction, unspecified: Secondary | ICD-10-CM | POA: Diagnosis not present

## 2014-12-26 DIAGNOSIS — Z9181 History of falling: Secondary | ICD-10-CM | POA: Diagnosis not present

## 2014-12-26 DIAGNOSIS — R260 Ataxic gait: Secondary | ICD-10-CM | POA: Diagnosis not present

## 2014-12-31 DIAGNOSIS — Z9181 History of falling: Secondary | ICD-10-CM | POA: Diagnosis not present

## 2014-12-31 DIAGNOSIS — R2681 Unsteadiness on feet: Secondary | ICD-10-CM | POA: Diagnosis not present

## 2014-12-31 DIAGNOSIS — R260 Ataxic gait: Secondary | ICD-10-CM | POA: Diagnosis not present

## 2014-12-31 DIAGNOSIS — I639 Cerebral infarction, unspecified: Secondary | ICD-10-CM | POA: Diagnosis not present

## 2015-01-02 DIAGNOSIS — I639 Cerebral infarction, unspecified: Secondary | ICD-10-CM | POA: Diagnosis not present

## 2015-01-02 DIAGNOSIS — R2681 Unsteadiness on feet: Secondary | ICD-10-CM | POA: Diagnosis not present

## 2015-01-02 DIAGNOSIS — Z9181 History of falling: Secondary | ICD-10-CM | POA: Diagnosis not present

## 2015-01-02 DIAGNOSIS — R260 Ataxic gait: Secondary | ICD-10-CM | POA: Diagnosis not present

## 2015-01-21 DIAGNOSIS — E782 Mixed hyperlipidemia: Secondary | ICD-10-CM | POA: Diagnosis not present

## 2015-01-21 DIAGNOSIS — F419 Anxiety disorder, unspecified: Secondary | ICD-10-CM | POA: Diagnosis not present

## 2015-01-21 DIAGNOSIS — Z8673 Personal history of transient ischemic attack (TIA), and cerebral infarction without residual deficits: Secondary | ICD-10-CM | POA: Diagnosis not present

## 2015-01-21 DIAGNOSIS — M810 Age-related osteoporosis without current pathological fracture: Secondary | ICD-10-CM | POA: Diagnosis not present

## 2015-01-21 DIAGNOSIS — N183 Chronic kidney disease, stage 3 (moderate): Secondary | ICD-10-CM | POA: Diagnosis not present

## 2015-01-21 DIAGNOSIS — Z23 Encounter for immunization: Secondary | ICD-10-CM | POA: Diagnosis not present

## 2015-01-21 DIAGNOSIS — K5900 Constipation, unspecified: Secondary | ICD-10-CM | POA: Diagnosis not present

## 2015-01-21 DIAGNOSIS — J309 Allergic rhinitis, unspecified: Secondary | ICD-10-CM | POA: Diagnosis not present

## 2015-01-21 DIAGNOSIS — E559 Vitamin D deficiency, unspecified: Secondary | ICD-10-CM | POA: Diagnosis not present

## 2015-05-29 ENCOUNTER — Telehealth: Payer: Self-pay | Admitting: Neurology

## 2015-05-29 DIAGNOSIS — Z961 Presence of intraocular lens: Secondary | ICD-10-CM | POA: Diagnosis not present

## 2015-05-29 DIAGNOSIS — H52203 Unspecified astigmatism, bilateral: Secondary | ICD-10-CM | POA: Diagnosis not present

## 2015-05-29 MED ORDER — FA-PYRIDOXINE-CYANOCOBALAMIN 2.5-25-2 MG PO TABS: 1.0000 | ORAL_TABLET | Freq: Every day | ORAL | Status: DC

## 2015-05-29 NOTE — Telephone Encounter (Signed)
I believe gneric FOLTX is less expensive  One daily

## 2015-05-29 NOTE — Telephone Encounter (Signed)
Patient is calling. She has been taking L-Methylfolate-B12-B6-B2 (CEREFOLIN) 12-11-48-5 MG TABS and it is too expensive. Is there another medication she can take that is cheaper?  Patient would like a call back to discuss before calling to her pharmacy. Thank you.

## 2015-05-29 NOTE — Telephone Encounter (Signed)
I have spoken with Ms. Harth this am and per RAS, advised generic Foltx should be cheaper.  She verbalized understanding of same--rx. escribed to [facility] per her request/fim

## 2015-08-22 DIAGNOSIS — L219 Seborrheic dermatitis, unspecified: Secondary | ICD-10-CM | POA: Diagnosis not present

## 2015-08-22 DIAGNOSIS — L578 Other skin changes due to chronic exposure to nonionizing radiation: Secondary | ICD-10-CM | POA: Diagnosis not present

## 2015-08-22 DIAGNOSIS — L814 Other melanin hyperpigmentation: Secondary | ICD-10-CM | POA: Diagnosis not present

## 2015-08-26 DIAGNOSIS — K5900 Constipation, unspecified: Secondary | ICD-10-CM | POA: Diagnosis not present

## 2015-08-26 DIAGNOSIS — E559 Vitamin D deficiency, unspecified: Secondary | ICD-10-CM | POA: Diagnosis not present

## 2015-08-26 DIAGNOSIS — Z1389 Encounter for screening for other disorder: Secondary | ICD-10-CM | POA: Diagnosis not present

## 2015-08-26 DIAGNOSIS — F419 Anxiety disorder, unspecified: Secondary | ICD-10-CM | POA: Diagnosis not present

## 2015-08-26 DIAGNOSIS — N183 Chronic kidney disease, stage 3 (moderate): Secondary | ICD-10-CM | POA: Diagnosis not present

## 2015-08-26 DIAGNOSIS — Z8673 Personal history of transient ischemic attack (TIA), and cerebral infarction without residual deficits: Secondary | ICD-10-CM | POA: Diagnosis not present

## 2015-08-26 DIAGNOSIS — J309 Allergic rhinitis, unspecified: Secondary | ICD-10-CM | POA: Diagnosis not present

## 2015-08-26 DIAGNOSIS — E782 Mixed hyperlipidemia: Secondary | ICD-10-CM | POA: Diagnosis not present

## 2015-08-26 DIAGNOSIS — M810 Age-related osteoporosis without current pathological fracture: Secondary | ICD-10-CM | POA: Diagnosis not present

## 2016-01-10 DIAGNOSIS — M79651 Pain in right thigh: Secondary | ICD-10-CM | POA: Diagnosis not present

## 2016-01-13 ENCOUNTER — Other Ambulatory Visit: Payer: Self-pay | Admitting: Family Medicine

## 2016-01-13 ENCOUNTER — Ambulatory Visit
Admission: RE | Admit: 2016-01-13 | Discharge: 2016-01-13 | Disposition: A | Discharge status: Home / Self Care | Payer: Medicare Other | Source: Ambulatory Visit | Attending: Family Medicine | Admitting: Family Medicine

## 2016-01-13 DIAGNOSIS — M79651 Pain in right thigh: Secondary | ICD-10-CM

## 2016-01-13 DIAGNOSIS — S79921A Unspecified injury of right thigh, initial encounter: Secondary | ICD-10-CM | POA: Diagnosis not present

## 2016-01-13 DIAGNOSIS — M25551 Pain in right hip: Secondary | ICD-10-CM | POA: Diagnosis not present

## 2016-01-13 DIAGNOSIS — S3993XA Unspecified injury of pelvis, initial encounter: Secondary | ICD-10-CM | POA: Diagnosis not present

## 2016-01-23 DIAGNOSIS — Z471 Aftercare following joint replacement surgery: Secondary | ICD-10-CM | POA: Diagnosis not present

## 2016-01-23 DIAGNOSIS — Z96641 Presence of right artificial hip joint: Secondary | ICD-10-CM | POA: Diagnosis not present

## 2016-02-14 ENCOUNTER — Other Ambulatory Visit (HOSPITAL_COMMUNITY): Payer: Self-pay | Admitting: Orthopedic Surgery

## 2016-02-14 DIAGNOSIS — Z96641 Presence of right artificial hip joint: Secondary | ICD-10-CM

## 2016-02-18 ENCOUNTER — Encounter (HOSPITAL_COMMUNITY)
Admission: RE | Admit: 2016-02-18 | Discharge: 2016-02-18 | Disposition: A | Discharge status: Home / Self Care | Payer: Medicare Other | Source: Ambulatory Visit | Attending: Orthopedic Surgery | Admitting: Orthopedic Surgery

## 2016-02-18 DIAGNOSIS — Z96641 Presence of right artificial hip joint: Secondary | ICD-10-CM | POA: Insufficient documentation

## 2016-02-18 DIAGNOSIS — M25551 Pain in right hip: Secondary | ICD-10-CM | POA: Diagnosis not present

## 2016-02-18 MED ORDER — TECHNETIUM TC 99M MEDRONATE IV KIT
25.0000 | PACK | Freq: Once | INTRAVENOUS | Status: AC | PRN
Start: 2016-02-18 — End: 2016-02-18
  Administered 2016-02-18: 27 via INTRAVENOUS

## 2016-02-28 DIAGNOSIS — M7061 Trochanteric bursitis, right hip: Secondary | ICD-10-CM | POA: Diagnosis not present

## 2016-02-28 DIAGNOSIS — Z96641 Presence of right artificial hip joint: Secondary | ICD-10-CM | POA: Diagnosis not present

## 2016-02-28 DIAGNOSIS — S39012A Strain of muscle, fascia and tendon of lower back, initial encounter: Secondary | ICD-10-CM | POA: Diagnosis not present

## 2016-02-28 DIAGNOSIS — Z4789 Encounter for other orthopedic aftercare: Secondary | ICD-10-CM | POA: Diagnosis not present

## 2016-04-01 DIAGNOSIS — M7061 Trochanteric bursitis, right hip: Secondary | ICD-10-CM | POA: Diagnosis not present

## 2016-04-01 DIAGNOSIS — Z96641 Presence of right artificial hip joint: Secondary | ICD-10-CM | POA: Diagnosis not present

## 2016-04-01 DIAGNOSIS — Z4789 Encounter for other orthopedic aftercare: Secondary | ICD-10-CM | POA: Diagnosis not present

## 2016-04-01 DIAGNOSIS — S39012D Strain of muscle, fascia and tendon of lower back, subsequent encounter: Secondary | ICD-10-CM | POA: Diagnosis not present

## 2016-04-17 DIAGNOSIS — N183 Chronic kidney disease, stage 3 (moderate): Secondary | ICD-10-CM | POA: Diagnosis not present

## 2016-04-17 DIAGNOSIS — F419 Anxiety disorder, unspecified: Secondary | ICD-10-CM | POA: Diagnosis not present

## 2016-04-17 DIAGNOSIS — Z23 Encounter for immunization: Secondary | ICD-10-CM | POA: Diagnosis not present

## 2016-04-17 DIAGNOSIS — E559 Vitamin D deficiency, unspecified: Secondary | ICD-10-CM | POA: Diagnosis not present

## 2016-04-17 DIAGNOSIS — K5900 Constipation, unspecified: Secondary | ICD-10-CM | POA: Diagnosis not present

## 2016-04-17 DIAGNOSIS — J309 Allergic rhinitis, unspecified: Secondary | ICD-10-CM | POA: Diagnosis not present

## 2016-04-17 DIAGNOSIS — M810 Age-related osteoporosis without current pathological fracture: Secondary | ICD-10-CM | POA: Diagnosis not present

## 2016-04-17 DIAGNOSIS — G2581 Restless legs syndrome: Secondary | ICD-10-CM | POA: Diagnosis not present

## 2016-04-17 DIAGNOSIS — E782 Mixed hyperlipidemia: Secondary | ICD-10-CM | POA: Diagnosis not present

## 2016-04-17 DIAGNOSIS — Z8673 Personal history of transient ischemic attack (TIA), and cerebral infarction without residual deficits: Secondary | ICD-10-CM | POA: Diagnosis not present

## 2016-05-20 DIAGNOSIS — L218 Other seborrheic dermatitis: Secondary | ICD-10-CM | POA: Diagnosis not present

## 2016-06-24 DIAGNOSIS — H524 Presbyopia: Secondary | ICD-10-CM | POA: Diagnosis not present

## 2016-06-24 DIAGNOSIS — Z961 Presence of intraocular lens: Secondary | ICD-10-CM | POA: Diagnosis not present

## 2016-07-28 DIAGNOSIS — L570 Actinic keratosis: Secondary | ICD-10-CM | POA: Diagnosis not present

## 2016-07-28 DIAGNOSIS — L821 Other seborrheic keratosis: Secondary | ICD-10-CM | POA: Diagnosis not present

## 2016-07-28 DIAGNOSIS — L218 Other seborrheic dermatitis: Secondary | ICD-10-CM | POA: Diagnosis not present

## 2016-07-28 DIAGNOSIS — L814 Other melanin hyperpigmentation: Secondary | ICD-10-CM | POA: Diagnosis not present

## 2016-10-20 DIAGNOSIS — F419 Anxiety disorder, unspecified: Secondary | ICD-10-CM | POA: Diagnosis not present

## 2016-10-20 DIAGNOSIS — K5900 Constipation, unspecified: Secondary | ICD-10-CM | POA: Diagnosis not present

## 2016-10-20 DIAGNOSIS — N183 Chronic kidney disease, stage 3 (moderate): Secondary | ICD-10-CM | POA: Diagnosis not present

## 2016-10-20 DIAGNOSIS — I6930 Unspecified sequelae of cerebral infarction: Secondary | ICD-10-CM | POA: Diagnosis not present

## 2016-10-20 DIAGNOSIS — M810 Age-related osteoporosis without current pathological fracture: Secondary | ICD-10-CM | POA: Diagnosis not present

## 2016-10-20 DIAGNOSIS — J309 Allergic rhinitis, unspecified: Secondary | ICD-10-CM | POA: Diagnosis not present

## 2016-10-20 DIAGNOSIS — E782 Mixed hyperlipidemia: Secondary | ICD-10-CM | POA: Diagnosis not present

## 2016-10-20 DIAGNOSIS — E559 Vitamin D deficiency, unspecified: Secondary | ICD-10-CM | POA: Diagnosis not present

## 2016-11-11 DIAGNOSIS — C44622 Squamous cell carcinoma of skin of right upper limb, including shoulder: Secondary | ICD-10-CM | POA: Diagnosis not present

## 2016-11-11 DIAGNOSIS — Z85828 Personal history of other malignant neoplasm of skin: Secondary | ICD-10-CM | POA: Diagnosis not present

## 2016-11-11 DIAGNOSIS — L853 Xerosis cutis: Secondary | ICD-10-CM | POA: Diagnosis not present

## 2016-11-11 DIAGNOSIS — L821 Other seborrheic keratosis: Secondary | ICD-10-CM | POA: Diagnosis not present

## 2016-12-24 DIAGNOSIS — R194 Change in bowel habit: Secondary | ICD-10-CM | POA: Diagnosis not present

## 2016-12-24 DIAGNOSIS — K5900 Constipation, unspecified: Secondary | ICD-10-CM | POA: Diagnosis not present

## 2017-02-01 ENCOUNTER — Other Ambulatory Visit: Payer: Self-pay | Admitting: Physician Assistant

## 2017-02-01 ENCOUNTER — Ambulatory Visit
Admission: RE | Admit: 2017-02-01 | Discharge: 2017-02-01 | Disposition: A | Discharge status: Home / Self Care | Payer: Medicare Other | Source: Ambulatory Visit | Attending: Physician Assistant | Admitting: Physician Assistant

## 2017-02-01 DIAGNOSIS — K5900 Constipation, unspecified: Secondary | ICD-10-CM

## 2017-02-01 DIAGNOSIS — R143 Flatulence: Secondary | ICD-10-CM

## 2017-02-01 DIAGNOSIS — R1030 Lower abdominal pain, unspecified: Secondary | ICD-10-CM | POA: Diagnosis not present

## 2017-02-01 DIAGNOSIS — R109 Unspecified abdominal pain: Secondary | ICD-10-CM | POA: Diagnosis not present

## 2017-02-01 DIAGNOSIS — R194 Change in bowel habit: Secondary | ICD-10-CM

## 2017-02-01 DIAGNOSIS — Z1211 Encounter for screening for malignant neoplasm of colon: Secondary | ICD-10-CM | POA: Diagnosis not present

## 2017-02-17 DIAGNOSIS — K5900 Constipation, unspecified: Secondary | ICD-10-CM | POA: Diagnosis not present

## 2017-02-17 DIAGNOSIS — R194 Change in bowel habit: Secondary | ICD-10-CM | POA: Diagnosis not present

## 2017-03-04 ENCOUNTER — Emergency Department (HOSPITAL_COMMUNITY): Payer: Medicare Other

## 2017-03-04 ENCOUNTER — Emergency Department (HOSPITAL_COMMUNITY)
Admission: EM | Admit: 2017-03-04 | Discharge: 2017-03-05 | Disposition: A | Discharge status: Home / Self Care | Payer: Medicare Other | Source: Home / Self Care | Attending: Emergency Medicine | Admitting: Emergency Medicine

## 2017-03-04 DIAGNOSIS — K2970 Gastritis, unspecified, without bleeding: Secondary | ICD-10-CM | POA: Diagnosis not present

## 2017-03-04 DIAGNOSIS — R110 Nausea: Secondary | ICD-10-CM | POA: Diagnosis not present

## 2017-03-04 DIAGNOSIS — Z79899 Other long term (current) drug therapy: Secondary | ICD-10-CM

## 2017-03-04 DIAGNOSIS — Z96641 Presence of right artificial hip joint: Secondary | ICD-10-CM | POA: Insufficient documentation

## 2017-03-04 DIAGNOSIS — Z7982 Long term (current) use of aspirin: Secondary | ICD-10-CM

## 2017-03-04 DIAGNOSIS — R1110 Vomiting, unspecified: Secondary | ICD-10-CM | POA: Diagnosis not present

## 2017-03-04 DIAGNOSIS — R140 Abdominal distension (gaseous): Secondary | ICD-10-CM | POA: Diagnosis not present

## 2017-03-04 DIAGNOSIS — K56609 Unspecified intestinal obstruction, unspecified as to partial versus complete obstruction: Secondary | ICD-10-CM | POA: Diagnosis not present

## 2017-03-04 DIAGNOSIS — K5900 Constipation, unspecified: Secondary | ICD-10-CM | POA: Insufficient documentation

## 2017-03-04 DIAGNOSIS — Z66 Do not resuscitate: Secondary | ICD-10-CM | POA: Diagnosis not present

## 2017-03-04 DIAGNOSIS — C786 Secondary malignant neoplasm of retroperitoneum and peritoneum: Secondary | ICD-10-CM | POA: Diagnosis not present

## 2017-03-04 DIAGNOSIS — R109 Unspecified abdominal pain: Secondary | ICD-10-CM | POA: Diagnosis not present

## 2017-03-04 DIAGNOSIS — C787 Secondary malignant neoplasm of liver and intrahepatic bile duct: Secondary | ICD-10-CM | POA: Diagnosis not present

## 2017-03-04 DIAGNOSIS — C189 Malignant neoplasm of colon, unspecified: Secondary | ICD-10-CM | POA: Diagnosis not present

## 2017-03-04 DIAGNOSIS — N179 Acute kidney failure, unspecified: Secondary | ICD-10-CM | POA: Diagnosis not present

## 2017-03-04 LAB — COMPREHENSIVE METABOLIC PANEL
ALBUMIN: 4.1 g/dL (ref 3.5–5.0)
ALT: 11 U/L — AB (ref 14–54)
AST: 24 U/L (ref 15–41)
Alkaline Phosphatase: 93 U/L (ref 38–126)
Anion gap: 11 (ref 5–15)
BUN: 27 mg/dL — AB (ref 6–20)
CO2: 23 mmol/L (ref 22–32)
CREATININE: 0.93 mg/dL (ref 0.44–1.00)
Calcium: 9.6 mg/dL (ref 8.9–10.3)
Chloride: 106 mmol/L (ref 101–111)
GFR calc Af Amer: >60 mL/min (ref >60)
GFR, EST NON AFRICAN AMERICAN: 52 mL/min — AB (ref >60)
GLUCOSE: 125 mg/dL — AB (ref 65–99)
Potassium: 3.1 mmol/L — ABNORMAL LOW (ref 3.5–5.1)
Sodium: 140 mmol/L (ref 135–145)
Total Bilirubin: 1.3 mg/dL — ABNORMAL HIGH (ref 0.3–1.2)
Total Protein: 7.3 g/dL (ref 6.5–8.1)

## 2017-03-04 LAB — CBC
HCT: 40.4 % (ref 36.0–46.0)
Hemoglobin: 13.5 g/dL (ref 12.0–15.0)
MCH: 29.7 pg (ref 26.0–34.0)
MCHC: 33.4 g/dL (ref 30.0–36.0)
MCV: 88.8 fL (ref 78.0–100.0)
PLATELETS: 273 10*3/uL (ref 150–400)
RBC: 4.55 MIL/uL (ref 3.87–5.11)
RDW: 14.1 % (ref 11.5–15.5)
WBC: 6.5 10*3/uL (ref 4.0–10.5)

## 2017-03-04 LAB — LIPASE, BLOOD: Lipase: 30 U/L (ref 11–51)

## 2017-03-04 MED ORDER — IOPAMIDOL (ISOVUE-300) INJECTION 61%
INTRAVENOUS | Status: AC
  Filled 2017-03-04: qty 100

## 2017-03-04 MED ORDER — IOPAMIDOL (ISOVUE-300) INJECTION 61%
30.0000 mL | Freq: Once | INTRAVENOUS | Status: AC | PRN
  Administered 2017-03-04: 30 mL via ORAL

## 2017-03-04 MED ORDER — DICYCLOMINE HCL 10 MG PO CAPS
10.0000 mg | ORAL_CAPSULE | Freq: Once | ORAL | Status: AC
  Administered 2017-03-04: 10 mg via ORAL
  Filled 2017-03-04: qty 1

## 2017-03-04 MED ORDER — IOPAMIDOL (ISOVUE-300) INJECTION 61%
INTRAVENOUS | Status: AC
  Filled 2017-03-04: qty 30

## 2017-03-04 MED ORDER — IOPAMIDOL (ISOVUE-300) INJECTION 61%
100.0000 mL | Freq: Once | INTRAVENOUS | Status: AC | PRN
  Administered 2017-03-05: 75 mL via INTRAVENOUS

## 2017-03-04 NOTE — ED Triage Notes (Signed)
Pt presents from [place] after having lower abdominal pain today. Worse with movement. Nausea. Alert and oriented.

## 2017-03-04 NOTE — ED Notes (Signed)
Pt had one episode of emesis after drinking one bottle of contrast. Pt unable to complete second bottle of contrast.

## 2017-03-04 NOTE — ED Notes (Signed)
Pt had drawn in triage for labs: Gold Lavender Blue Lt green Dark green  

## 2017-03-04 NOTE — ED Notes (Signed)
Pt states she does not have the urge to urinate at this time.

## 2017-03-04 NOTE — ED Provider Notes (Signed)
[facility] DEPT Provider Note   CSN: [id number] Arrival date & time: 03/04/17  1804     History   Chief Complaint Chief Complaint  Patient presents with  . Abdominal Pain    HPI [name] is a 83 y.o. female who presents emergency Department with chief complaint of abdominal pain and distention. Patient states that she has a history of chronic constipation and for the past 3 months has only made very small bowel movements. She has had decreased appetite and some nausea. She denies vomiting, fevers, chest pain, shortness of breath, urinary symptoms.  HPI  Past Medical History:  Diagnosis Date  . Anxiety   . Arthritis   . Chronic kidney disease   . Constipation   . Movement disorder   . Sinus drainage   . Stroke    hx of ministroke in 2011   . Vision abnormalities     Patient Active Problem List   Diagnosis Date Noted  . Acute CVA (cerebrovascular accident) ([place]) 11/01/2014  . Elevated homocysteine ([place]) 11/01/2014  . Acute blood loss anemia 03/09/2012  . S/P left THA, AA 03/08/2012    Past Surgical History:  Procedure Laterality Date  . EYE SURGERY     bilateral cataract surgery   . JOINT REPLACEMENT     right hip   . TOTAL HIP ARTHROPLASTY  03/08/2012   Procedure: TOTAL HIP ARTHROPLASTY ANTERIOR APPROACH;  Surgeon: Mauri Pole, MD;  Location: [facility];  Service: Orthopedics;  Laterality: Left;  . tumor removed right breast      benign     OB History    No data available       Home Medications    Prior to Admission medications   Medication Sig Start Date End Date Taking? Authorizing Provider  ALPRAZolam Duanne Moron) 0.5 MG tablet Take 0.25 mg by mouth at bedtime as needed for sleep. Anxiety   Yes [provider]  aspirin 81 MG chewable tablet Chew 81 mg by mouth daily.   Yes [provider]  Cholecalciferol (VITAMIN D) 2000 UNITS tablet Take 2,000 Units by mouth every morning.   Yes [provider]  clopidogrel (PLAVIX) 75  MG tablet Take 1 tablet (75 mg total) by mouth daily. Patient not taking: Reported on 03/04/2017 11/01/14   Britt Bottom, MD  diphenhydrAMINE (BENADRYL) 25 mg capsule Take 1 capsule (25 mg total) by mouth every 6 (six) hours as needed for itching, allergies or sleep. Patient not taking: Reported on 03/04/2017 03/10/12 03/20/12  Danae Orleans, PA-C  ferrous sulfate 325 (65 FE) MG tablet Take 1 tablet (325 mg total) by mouth 3 (three) times daily after meals. Patient not taking: Reported on 03/04/2017 03/10/12 03/10/13  Danae Orleans, PA-C  folic acid-pyridoxine-cyancobalamin (FOLTX) 2.5-25-2 MG TABS tablet Take 1 tablet by mouth daily. Patient not taking: Reported on 03/04/2017 05/29/15   Britt Bottom, MD  L-Methylfolate-B12-B6-B2 (CEREFOLIN) 12-11-48-5 MG TABS Take 1 tablet by mouth daily. Patient not taking: Reported on 03/04/2017 11/05/14   Britt Bottom, MD    Family History Family History  Problem Relation Age of Onset  . Heart disease Mother   . Dementia Mother   . COPD Father     Social History Social History  Substance Use Topics  . Smoking status: Never Smoker  . Smokeless tobacco: Never Used  . Alcohol use Not on file     Allergies   Patient has no known allergies.   Review of Systems Review of Systems  Ten systems reviewed and are negative for acute change, except as noted in the HPI.   Physical Exam Updated Vital Signs BP (!) 155/90 (BP Location: Left Arm)   Pulse 97   Resp 18   SpO2 96%   Physical Exam  Constitutional: She is oriented to person, place, and time. She appears well-developed and well-nourished. No distress.  HENT:  Head: Normocephalic and atraumatic.  Eyes: Conjunctivae are normal. No scleral icterus.  Neck: Normal range of motion.  Cardiovascular: Normal rate, regular rhythm and normal heart sounds.  Exam reveals no gallop and no friction rub.   No murmur heard. Pulmonary/Chest: Effort normal and breath sounds normal. No respiratory  distress.  Abdominal: Soft. Bowel sounds are normal. She exhibits distension. She exhibits no mass. There is tenderness. There is no guarding.  Mild diffuse tenderness  Genitourinary:  Genitourinary Comments: Digital Rectal Exam reveals sphincter with good tone. No external hemorrhoids. No masses or fissures. No stool felt in the rectal vault  Neurological: She is alert and oriented to person, place, and time.  Skin: Skin is warm and dry. She is not diaphoretic.  Psychiatric: Her behavior is normal.  Nursing note and vitals reviewed.    ED Treatments / Results  Labs (all labs ordered are listed, but only abnormal results are displayed) Labs Reviewed  COMPREHENSIVE METABOLIC PANEL - Abnormal; Notable for the following:       Result Value   Potassium 3.1 (*)    Glucose, Bld 125 (*)    BUN 27 (*)    ALT 11 (*)    Total Bilirubin 1.3 (*)    GFR calc non Af Amer 52 (*)    All other components within normal limits  LIPASE, BLOOD  CBC  URINALYSIS, ROUTINE W REFLEX MICROSCOPIC    EKG  EKG Interpretation None       Radiology Ct Abdomen Pelvis W Contrast  Result Date: 03/05/2017 CLINICAL DATA:  83 year old with abdominal pain and distension. EXAM: CT ABDOMEN AND PELVIS WITH CONTRAST TECHNIQUE: Multidetector CT imaging of the abdomen and pelvis was performed using the standard protocol following bolus administration of intravenous contrast. CONTRAST:  75 cc Isovue 300 IV COMPARISON:  Radiograph 02/01/2017. FINDINGS: Lower chest: Linear atelectasis in the left lower lobe. No confluent consolidation. No pleural effusion. Hepatobiliary: No focal hepatic lesion. The gallbladder is physiologically distended, displaced anteriorly without gallbladder inflammation. There is mild intrahepatic biliary ductal dilatation. Common bile duct is not well-defined. Pancreas: Parenchymal atrophy. No ductal dilatation or inflammation. There is a 1.3 x 1.1 cm cystic structure adjacent to the uncinate process  of pancreas. Spleen: Small inside with irregular contours. Ill-defined low-density lesions in the lower spleen. Minimal adjacent fluid/ edema. Adrenals/Urinary Tract: No adrenal nodule. No hydronephrosis or perinephric edema. Homogeneous enhancement with symmetric excretion on delayed phase imaging. Urinary bladder is obscured by streak artifact from bilateral hip prostheses. Stomach/Bowel: Marked colonic stool burden with large volume of stool in the ascending, transverse, descending and proximal sigmoid colon with associated tortuosity. Probable transition in the mid sigmoid image 61 series 2 with possible wall thickening. The distal most sigmoid colon appears decompressed, however are partially obscured by streak artifact from adjacent hip prosthesis. No evidence of volvulus. No small bowel dilatation. There is mild fecalization of distal small bowel contents in keeping with slow transit. Appendix is not visualized. Vascular/Lymphatic: Mild aortic atherosclerosis tortuosity. No definite abdominal or pelvic adenopathy we common pelvis partially obscured by streak artifact. Reproductive: Pelvis and adnexa are obscured by  streak artifact. Uterus not definitively seen. Other: No abdominal ascites, cannot assess for pelvic ascites. No free air. Musculoskeletal: Multilevel degenerative change in the lumbar spine, degenerative type anterolisthesis of L4 on L5 and L5 on S1. Bilateral hip arthroplasties. No focal bone lesion. IMPRESSION: 1. Very large stool burden and colonic tortuosity resulting in abdominal distension. Transition in the mid sigmoid in the regional possible wall thickening, distal most sigmoid colon is decompressed. This may be due to stricture, neoplasm, less likely peristalsis. Recommend direct visualization with colonoscopy. 2. No small bowel dilatation or obstruction. 3. Small size spleen with irregular contours, low-density lesions inferiorly. There is also mild adjacent edema/stranding. No prior  exams available for comparison. Splenic lesions are typically benign, however if colonic neoplasm is demonstrated metastatic disease is not excluded. Alternatively this may be sequela of prior splenic infarct. 4. A 1.3 cm cystic lesion adjacent to the uncinate process of pancreas. Given patient's age (greater than 53 years) recommend follow-up CT in 2 years. 5. Mild intrahepatic biliary ductal dilatation without identifiable cause. No abnormal gallbladder distention. 6.  Aortic Atherosclerosis (ICD10-I70.0). Electronically Signed   By: Jeb Levering M.D.   On: 03/05/2017 00:29    Procedures Procedures (including critical care time)  Medications Ordered in ED Medications  iopamidol (ISOVUE-300) 61 % injection (not administered)  iopamidol (ISOVUE-300) 61 % injection (not administered)  magnesium citrate solution 1 Bottle (not administered)  dicyclomine (BENTYL) capsule 10 mg (10 mg Oral Given 03/04/17 2226)  iopamidol (ISOVUE-300) 61 % injection 30 mL (30 mLs Oral Contrast Given 03/04/17 2230)  iopamidol (ISOVUE-300) 61 % injection 100 mL (75 mLs Intravenous Contrast Given 03/05/17 0001)     Initial Impression / Assessment and Plan / ED Course  I have reviewed the triage vital signs and the nursing notes.  Pertinent labs & imaging results that were available during my care of the patient were reviewed by me and considered in my medical decision making (see chart for details).  Clinical Course as of Mar 05 138  Fri Mar 05, 2017  0035 CT Abdomen Pelvis W Contrast [EW]  1497 CT Abdomen Pelvis W Contrast [EW]    Clinical Course User Index [EW] Daleen Bo, MD    Patient with obstipation. No fecal impaction. I discussed the CT findings with the patient including possibility of neoplasm at inflamed transition point. Patient is afebrile. She does not have an elevated white blood cell count. She has tenesmus and cramping without significant abdominal pain. Patient states that if she did  have a neoplasm. She would not want to do anything about it at her age. I stated that the CT scan radiology read suggested a repeat scan in 2 years. Patient is set up for colonoscopy on September 6. We'll give her magnesium citrate tonight. Discharge. She may follow up with glycerin suppositories at home.  Fi fecalnal Clinical Impressions(s) / ED Diagnoses   Final diagnoses:  Obstipation    New Prescriptions New Prescriptions   No medications on file     Margarita Mail, PA-C 03/05/17 0142    Daleen Bo, MD 03/05/17 [phone]

## 2017-03-05 DIAGNOSIS — R140 Abdominal distension (gaseous): Secondary | ICD-10-CM | POA: Diagnosis not present

## 2017-03-05 DIAGNOSIS — R109 Unspecified abdominal pain: Secondary | ICD-10-CM | POA: Diagnosis not present

## 2017-03-05 MED ORDER — MAGNESIUM CITRATE PO SOLN
1.0000 | Freq: Once | ORAL | Status: DC
  Filled 2017-03-05: qty 296

## 2017-03-05 NOTE — Discharge Instructions (Signed)
YOU MAY USE FLEET GLYCERINE SUPPOSITORY,. YOU WILL RECEIVE MAGNESIUM CITRATE TONIGHT. IF YOU STILL DO NOT HAVE A BM YOU MAY USE 10 CAP-FULLS OF MIRALAX IN 32 OZ OF LIQUID   Contact a health care provider if: You have pain that gets worse. You have a fever. You do not have a bowel movement after 4 days. You vomit. You are not hungry. You lose weight. You are bleeding from the anus. You have thin, pencil-like stools. Get help right away if: You have a fever and your symptoms suddenly get worse. You leak stool or have blood in your stool. Your abdomen is bloated. You have severe pain in your abdomen. You feel dizzy or you faint.

## 2017-03-05 NOTE — ED Notes (Signed)
Patient transported to CT 

## 2017-03-05 NOTE — ED Provider Notes (Signed)
  Face-to-face evaluation   History: She presents for evaluation of constipation for several days.  She tried magnesium citrate tonight without relief, and vomited once.  Physical exam: Alert elderly female who is comfortable.  Abdomen slightly distended.  Abdomen is soft and nontender to palpation.  Medical screening examination/treatment/procedure(s) were conducted as a shared visit with non-physician practitioner(s) and myself.  I personally evaluated the patient during the encounter    Daleen Bo, MD 03/05/17 [phone]

## 2017-03-07 ENCOUNTER — Inpatient Hospital Stay (HOSPITAL_COMMUNITY)
Admission: EM | Admit: 2017-03-07 | Discharge: 2017-03-16 | DRG: 330 | Disposition: A | Discharge status: Skilled Nursing Facility | Payer: Medicare Other | Attending: Internal Medicine | Admitting: Internal Medicine

## 2017-03-07 ENCOUNTER — Emergency Department (HOSPITAL_COMMUNITY): Payer: Medicare Other

## 2017-03-07 ENCOUNTER — Encounter (HOSPITAL_COMMUNITY): Payer: Self-pay

## 2017-03-07 DIAGNOSIS — D649 Anemia, unspecified: Secondary | ICD-10-CM | POA: Diagnosis present

## 2017-03-07 DIAGNOSIS — M1990 Unspecified osteoarthritis, unspecified site: Secondary | ICD-10-CM | POA: Diagnosis present

## 2017-03-07 DIAGNOSIS — R109 Unspecified abdominal pain: Secondary | ICD-10-CM | POA: Diagnosis not present

## 2017-03-07 DIAGNOSIS — C786 Secondary malignant neoplasm of retroperitoneum and peritoneum: Principal | ICD-10-CM | POA: Diagnosis present

## 2017-03-07 DIAGNOSIS — C787 Secondary malignant neoplasm of liver and intrahepatic bile duct: Secondary | ICD-10-CM | POA: Diagnosis present

## 2017-03-07 DIAGNOSIS — J9811 Atelectasis: Secondary | ICD-10-CM | POA: Diagnosis present

## 2017-03-07 DIAGNOSIS — Z8673 Personal history of transient ischemic attack (TIA), and cerebral infarction without residual deficits: Secondary | ICD-10-CM | POA: Diagnosis not present

## 2017-03-07 DIAGNOSIS — K2970 Gastritis, unspecified, without bleeding: Secondary | ICD-10-CM | POA: Diagnosis not present

## 2017-03-07 DIAGNOSIS — E876 Hypokalemia: Secondary | ICD-10-CM | POA: Diagnosis not present

## 2017-03-07 DIAGNOSIS — E860 Dehydration: Secondary | ICD-10-CM | POA: Diagnosis present

## 2017-03-07 DIAGNOSIS — Z7902 Long term (current) use of antithrombotics/antiplatelets: Secondary | ICD-10-CM | POA: Diagnosis not present

## 2017-03-07 DIAGNOSIS — K56609 Unspecified intestinal obstruction, unspecified as to partial versus complete obstruction: Secondary | ICD-10-CM | POA: Diagnosis not present

## 2017-03-07 DIAGNOSIS — K5900 Constipation, unspecified: Secondary | ICD-10-CM | POA: Diagnosis not present

## 2017-03-07 DIAGNOSIS — D72829 Elevated white blood cell count, unspecified: Secondary | ICD-10-CM | POA: Diagnosis present

## 2017-03-07 DIAGNOSIS — C800 Disseminated malignant neoplasm, unspecified: Secondary | ICD-10-CM | POA: Diagnosis not present

## 2017-03-07 DIAGNOSIS — K644 Residual hemorrhoidal skin tags: Secondary | ICD-10-CM | POA: Diagnosis present

## 2017-03-07 DIAGNOSIS — Z79899 Other long term (current) drug therapy: Secondary | ICD-10-CM

## 2017-03-07 DIAGNOSIS — R112 Nausea with vomiting, unspecified: Secondary | ICD-10-CM | POA: Diagnosis not present

## 2017-03-07 DIAGNOSIS — K5649 Other impaction of intestine: Secondary | ICD-10-CM | POA: Diagnosis not present

## 2017-03-07 DIAGNOSIS — N183 Chronic kidney disease, stage 3 (moderate): Secondary | ICD-10-CM | POA: Diagnosis present

## 2017-03-07 DIAGNOSIS — N179 Acute kidney failure, unspecified: Secondary | ICD-10-CM | POA: Diagnosis present

## 2017-03-07 DIAGNOSIS — C801 Malignant (primary) neoplasm, unspecified: Secondary | ICD-10-CM | POA: Diagnosis not present

## 2017-03-07 DIAGNOSIS — R4182 Altered mental status, unspecified: Secondary | ICD-10-CM | POA: Diagnosis not present

## 2017-03-07 DIAGNOSIS — G259 Extrapyramidal and movement disorder, unspecified: Secondary | ICD-10-CM | POA: Diagnosis present

## 2017-03-07 DIAGNOSIS — L8990 Pressure ulcer of unspecified site, unspecified stage: Secondary | ICD-10-CM | POA: Insufficient documentation

## 2017-03-07 DIAGNOSIS — Z96643 Presence of artificial hip joint, bilateral: Secondary | ICD-10-CM | POA: Diagnosis present

## 2017-03-07 DIAGNOSIS — R05 Cough: Secondary | ICD-10-CM | POA: Diagnosis not present

## 2017-03-07 DIAGNOSIS — K5669 Other partial intestinal obstruction: Secondary | ICD-10-CM | POA: Diagnosis not present

## 2017-03-07 DIAGNOSIS — Z66 Do not resuscitate: Secondary | ICD-10-CM | POA: Diagnosis present

## 2017-03-07 DIAGNOSIS — R55 Syncope and collapse: Secondary | ICD-10-CM

## 2017-03-07 DIAGNOSIS — R1110 Vomiting, unspecified: Secondary | ICD-10-CM | POA: Diagnosis not present

## 2017-03-07 DIAGNOSIS — R140 Abdominal distension (gaseous): Secondary | ICD-10-CM | POA: Diagnosis not present

## 2017-03-07 DIAGNOSIS — R110 Nausea: Secondary | ICD-10-CM | POA: Diagnosis not present

## 2017-03-07 DIAGNOSIS — R933 Abnormal findings on diagnostic imaging of other parts of digestive tract: Secondary | ICD-10-CM | POA: Diagnosis not present

## 2017-03-07 DIAGNOSIS — R059 Cough, unspecified: Secondary | ICD-10-CM

## 2017-03-07 DIAGNOSIS — I4891 Unspecified atrial fibrillation: Secondary | ICD-10-CM | POA: Diagnosis not present

## 2017-03-07 DIAGNOSIS — R7989 Other specified abnormal findings of blood chemistry: Secondary | ICD-10-CM | POA: Diagnosis present

## 2017-03-07 DIAGNOSIS — R938 Abnormal findings on diagnostic imaging of other specified body structures: Secondary | ICD-10-CM | POA: Diagnosis not present

## 2017-03-07 DIAGNOSIS — F419 Anxiety disorder, unspecified: Secondary | ICD-10-CM | POA: Diagnosis present

## 2017-03-07 DIAGNOSIS — C189 Malignant neoplasm of colon, unspecified: Secondary | ICD-10-CM | POA: Diagnosis present

## 2017-03-07 DIAGNOSIS — I480 Paroxysmal atrial fibrillation: Secondary | ICD-10-CM | POA: Diagnosis not present

## 2017-03-07 DIAGNOSIS — Z7982 Long term (current) use of aspirin: Secondary | ICD-10-CM | POA: Diagnosis not present

## 2017-03-07 DIAGNOSIS — R100 Acute abdomen: Secondary | ICD-10-CM | POA: Diagnosis not present

## 2017-03-07 DIAGNOSIS — D62 Acute posthemorrhagic anemia: Secondary | ICD-10-CM | POA: Diagnosis not present

## 2017-03-07 LAB — CBC WITH DIFFERENTIAL/PLATELET
BASOS ABS: 0 10*3/uL (ref 0.0–0.1)
BASOS PCT: 0 %
EOS PCT: 0 %
Eosinophils Absolute: 0 10*3/uL (ref 0.0–0.7)
HCT: 39.2 % (ref 36.0–46.0)
Hemoglobin: 12.9 g/dL (ref 12.0–15.0)
LYMPHS ABS: 0.4 10*3/uL — AB (ref 0.7–4.0)
Lymphocytes Relative: 7 %
MCH: 29.4 pg (ref 26.0–34.0)
MCHC: 32.9 g/dL (ref 30.0–36.0)
MCV: 89.3 fL (ref 78.0–100.0)
Monocytes Absolute: 0.4 10*3/uL (ref 0.1–1.0)
Monocytes Relative: 7 %
NEUTROS ABS: 5 10*3/uL (ref 1.7–7.7)
NEUTROS PCT: 86 %
PLATELETS: 283 10*3/uL (ref 150–400)
RBC: 4.39 MIL/uL (ref 3.87–5.11)
RDW: 14.5 % (ref 11.5–15.5)
WBC: 5.8 10*3/uL (ref 4.0–10.5)

## 2017-03-07 LAB — BASIC METABOLIC PANEL
Anion gap: 13 (ref 5–15)
BUN: 46 mg/dL — ABNORMAL HIGH (ref 6–20)
CHLORIDE: 104 mmol/L (ref 101–111)
CO2: 22 mmol/L (ref 22–32)
Calcium: 9.6 mg/dL (ref 8.9–10.3)
Creatinine, Ser: 1.65 mg/dL — ABNORMAL HIGH (ref 0.44–1.00)
GFR calc Af Amer: 30 mL/min — ABNORMAL LOW (ref >60)
GFR calc non Af Amer: 26 mL/min — ABNORMAL LOW (ref >60)
Glucose, Bld: 137 mg/dL — ABNORMAL HIGH (ref 65–99)
POTASSIUM: 3.7 mmol/L (ref 3.5–5.1)
Sodium: 139 mmol/L (ref 135–145)

## 2017-03-07 MED ORDER — ACETAMINOPHEN 325 MG PO TABS
650.0000 mg | ORAL_TABLET | Freq: Four times a day (QID) | ORAL | Status: DC | PRN
Start: 2017-03-07 — End: 2017-03-12

## 2017-03-07 MED ORDER — IOPAMIDOL (ISOVUE-300) INJECTION 61%
INTRAVENOUS | Status: AC
  Administered 2017-03-07: 100 mL
  Filled 2017-03-07: qty 100

## 2017-03-07 MED ORDER — SODIUM CHLORIDE 0.9 % IV BOLUS (SEPSIS)
500.0000 mL | Freq: Once | INTRAVENOUS | Status: AC
  Administered 2017-03-07: 500 mL via INTRAVENOUS

## 2017-03-07 MED ORDER — ACETAMINOPHEN 650 MG RE SUPP: 650.0000 mg | Freq: Four times a day (QID) | RECTAL | Status: DC | PRN

## 2017-03-07 MED ORDER — ONDANSETRON HCL 4 MG/2ML IJ SOLN
4.0000 mg | Freq: Once | INTRAMUSCULAR | Status: AC
  Administered 2017-03-07: 4 mg via INTRAVENOUS
  Filled 2017-03-07: qty 2

## 2017-03-07 MED ORDER — ONDANSETRON HCL 4 MG PO TABS: 4.0000 mg | ORAL_TABLET | Freq: Four times a day (QID) | ORAL | Status: DC | PRN

## 2017-03-07 MED ORDER — MORPHINE SULFATE (PF) 4 MG/ML IV SOLN: 0.5000 mg | INTRAVENOUS | Status: DC | PRN

## 2017-03-07 MED ORDER — ONDANSETRON HCL 4 MG/2ML IJ SOLN
4.0000 mg | Freq: Four times a day (QID) | INTRAMUSCULAR | Status: DC | PRN
  Administered 2017-03-13 (×2): 4 mg via INTRAVENOUS
  Filled 2017-03-07 (×2): qty 2

## 2017-03-07 MED ORDER — DEXTROSE-NACL 5-0.9 % IV SOLN
INTRAVENOUS | Status: DC
  Administered 2017-03-07: 20:00:00 via INTRAVENOUS

## 2017-03-07 NOTE — ED Provider Notes (Signed)
[place] DEPT Provider Note   CSN: [id number] Arrival date & time: 03/07/17  1339     History   Chief Complaint Chief Complaint  Patient presents with  . Constipation    HPI Arpi Diebold is a 83 y.o. female.  Patient is a 83 year old female who presents with constipation. She states she's had constipation issuesoff and on for a long time but most recently since about March. She tells me that she's not really taking anything for constipation although her relative states that she has tried some glycerin suppositories and occasionally MiraLAX but she is not taking anything regularly for constipation. She recently saw gastroenterologist and is scheduled for colonoscopy on September 6. She was also here in emergency recently for constipation and had a CT scan which showed a little bit of thickening of the colon, they cannot completely exclude neoplasm but no other significant abnormalities. She's had some associated nausea and vomiting with intermittent abdominal cramping. No fevers. No urinary symptoms.      Past Medical History:  Diagnosis Date  . Anxiety   . Arthritis   . Chronic kidney disease   . Constipation   . Movement disorder   . Sinus drainage   . Stroke [facility])    hx of ministroke in 2011   . Vision abnormalities     Patient Active Problem List   Diagnosis Date Noted  . Acute CVA (cerebrovascular accident) ([place]) 11/01/2014  . Elevated homocysteine ([place]) 11/01/2014  . Acute blood loss anemia 03/09/2012  . S/P left THA, AA 03/08/2012    Past Surgical History:  Procedure Laterality Date  . EYE SURGERY     bilateral cataract surgery   . JOINT REPLACEMENT     right hip   . TOTAL HIP ARTHROPLASTY  03/08/2012   Procedure: TOTAL HIP ARTHROPLASTY ANTERIOR APPROACH;  Surgeon: Mauri Pole, MD;  Location: [facility] ORS;  Service: Orthopedics;  Laterality: Left;  . tumor removed right breast      benign     OB History    No data available       Home  Medications    Prior to Admission medications   Medication Sig Start Date End Date Taking? Authorizing Provider  ALPRAZolam Duanne Moron) 0.5 MG tablet Take 0.25 mg by mouth at bedtime as needed for sleep. Anxiety    [provider]  aspirin 81 MG chewable tablet Chew 81 mg by mouth daily.    [provider]  Cholecalciferol (VITAMIN D) 2000 UNITS tablet Take 2,000 Units by mouth every morning.    [provider]  clopidogrel (PLAVIX) 75 MG tablet Take 1 tablet (75 mg total) by mouth daily. Patient not taking: Reported on 03/04/2017 11/01/14   Britt Bottom, MD  diphenhydrAMINE (BENADRYL) 25 mg capsule Take 1 capsule (25 mg total) by mouth every 6 (six) hours as needed for itching, allergies or sleep. Patient not taking: Reported on 03/04/2017 03/10/12 03/20/12  Danae Orleans, PA-C  ferrous sulfate 325 (65 FE) MG tablet Take 1 tablet (325 mg total) by mouth 3 (three) times daily after meals. Patient not taking: Reported on 03/04/2017 03/10/12 03/10/13  Danae Orleans, PA-C  folic acid-pyridoxine-cyancobalamin (FOLTX) 2.5-25-2 MG TABS tablet Take 1 tablet by mouth daily. Patient not taking: Reported on 03/04/2017 05/29/15   Britt Bottom, MD  L-Methylfolate-B12-B6-B2 (CEREFOLIN) 12-11-48-5 MG TABS Take 1 tablet by mouth daily. Patient not taking: Reported on 03/04/2017 11/05/14   Britt Bottom, MD    Family History Family History  Problem Relation Age of Onset  . Heart disease Mother   . Dementia Mother   . COPD Father     Social History Social History  Substance Use Topics  . Smoking status: Never Smoker  . Smokeless tobacco: Never Used  . Alcohol use Not on file     Allergies   Patient has no known allergies.   Review of Systems Review of Systems  Constitutional: Negative for chills, diaphoresis, fatigue and fever.  HENT: Negative for congestion, rhinorrhea and sneezing.   Eyes: Negative.   Respiratory: Negative for cough, chest tightness and shortness  of breath.   Cardiovascular: Negative for chest pain and leg swelling.  Gastrointestinal: Positive for abdominal pain, constipation, nausea and vomiting. Negative for blood in stool and diarrhea.  Genitourinary: Negative for difficulty urinating, flank pain, frequency and hematuria.  Musculoskeletal: Negative for arthralgias and back pain.  Skin: Negative for rash.  Neurological: Negative for dizziness, speech difficulty, weakness, numbness and headaches.     Physical Exam Updated Vital Signs BP 130/71   Pulse (!) 101   Temp 98.1 F (36.7 C) (Oral)   Resp 16   SpO2 98%   Physical Exam  Constitutional: She is oriented to person, place, and time. She appears well-developed and well-nourished.  HENT:  Head: Normocephalic and atraumatic.  Eyes: Pupils are equal, round, and reactive to light.  Neck: Normal range of motion. Neck supple.  Cardiovascular: Normal rate, regular rhythm and normal heart sounds.   Pulmonary/Chest: Effort normal and breath sounds normal. No respiratory distress. She has no wheezes. She has no rales. She exhibits no tenderness.  Abdominal: Soft. Bowel sounds are normal. She exhibits distension. There is no tenderness. There is no rebound and no guarding.  Tympanic to percussion  Genitourinary:  Genitourinary Comments: No impaction  Musculoskeletal: Normal range of motion. She exhibits no edema.  Lymphadenopathy:    She has no cervical adenopathy.  Neurological: She is alert and oriented to person, place, and time.  Skin: Skin is warm and dry. No rash noted.  Psychiatric: She has a normal mood and affect.     ED Treatments / Results  Labs (all labs ordered are listed, but only abnormal results are displayed) Labs Reviewed  BASIC METABOLIC PANEL - Abnormal; Notable for the following:       Result Value   Glucose, Bld 137 (*)    BUN 46 (*)    Creatinine, Ser 1.65 (*)    GFR calc non Af Amer 26 (*)    GFR calc Af Amer 30 (*)    All other components  within normal limits  CBC WITH DIFFERENTIAL/PLATELET - Abnormal; Notable for the following:    Lymphs Abs 0.4 (*)    All other components within normal limits    EKG  EKG Interpretation None       Radiology Ct Abdomen Pelvis W Contrast  Result Date: 03/07/2017 CLINICAL DATA:  Abdominal distension and lower abdominal pain for 2 weeks. History of chronic kidney disease. EXAM: CT ABDOMEN AND PELVIS WITH CONTRAST TECHNIQUE: Multidetector CT imaging of the abdomen and pelvis was performed using the standard protocol following bolus administration of intravenous contrast. CONTRAST:  184mL ISOVUE-300 IOPAMIDOL (ISOVUE-300) INJECTION 61% COMPARISON:  CT abdomen and pelvis March 04, 2017 FINDINGS: LOWER CHEST: Lung bases are clear. Included heart size is normal. No pericardial effusion. HEPATOBILIARY: Vicarious excretion of contrast in the gallbladder which is mildly distended, otherwise unremarkable. Mild periportal edema, liver is otherwise normal. PANCREAS: Re- demonstration  of 14 mm lobular cystic mass in uncinate of pancreas. Given patient's age of greater than 42 years old, recommend follow-up CT in 2 years. SPLEEN: 14 mm benign-appearing cyst within the spleen, unchanged. ADRENALS/URINARY TRACT: Kidneys are orthotopic, demonstrating symmetric enhancement. No nephrolithiasis, hydronephrosis or solid renal masses. The unopacified ureters are normal in course and caliber. Delayed imaging through the kidneys demonstrates symmetric prompt contrast excretion within the proximal urinary collecting system. Urinary bladder is partially distended and unremarkable. Normal adrenal glands. STOMACH/BOWEL: Large amount of retained large bowel stool with transition point in the sigmoid colon associated with wall thickening, potential mass. Limited assessment due to streak artifact from hip arthroplasties. Small bowel feces and small and large bowel air-fluid levels. Probable gastric antral diverticulum.  VASCULAR/LYMPHATIC: Aortoiliac vessels are normal in course and caliber. Mild aortic atherosclerosis. No lymphadenopathy by CT size criteria. REPRODUCTIVE: Status post hysterectomy. OTHER: Small volume ascites. No intraperitoneal free air or drainable fluid collection. MUSCULOSKELETAL: Nonacute. Streak artifact from bilateral hip total arthroplasties. Grade 1 L4-5 grade 1 L5-S1 anterolisthesis on degenerative basis. IMPRESSION: 1. Persistent distal large bowel obstruction with transition point in sigmoid colon concerning for neoplasm, less likely stricture. Recommend colonoscopy. Large volume retained large bowel stool. 2. Increasing small volume ascites. 3. Re- demonstration of 14 mm pancreatic cystic mass. Considering patient's age of greater than 54, recommend follow-up CT in 2 years. This recommendation follows ACR consensus guidelines: Management of Incidental Pancreatic Cysts: A White Paper of the ACR Incidental Findings Committee. [place] 9147;82:956-213. Aortic Atherosclerosis (ICD10-I70.0). Electronically Signed   By: Elon Alas M.D.   On: 03/07/2017 19:09   Dg Abd Acute W/chest  Result Date: 03/07/2017 CLINICAL DATA:  Vomiting for several hours EXAM: DG ABDOMEN ACUTE W/ 1V CHEST COMPARISON:  None. FINDINGS: Cardiac shadow is within normal limits. The lungs are well aerated bilaterally. Mild aortic calcifications are seen. Scattered large and small bowel gas is noted. Mild small bowel dilatation is noted consistent with at least a partial small bowel obstruction. These changes are new from the prior exam. Degenerative changes of lumbar spine are seen. Bilateral hip replacements are noted. IMPRESSION: Ages consistent with ileus partial small bowel obstruction. Electronically Signed   By: Inez Catalina M.D.   On: 03/07/2017 17:21    Procedures Procedures (including critical care time)  Medications Ordered in ED Medications  sodium chloride 0.9 % bolus 500 mL (0 mLs Intravenous  Stopped 03/07/17 1904)  ondansetron (ZOFRAN) injection 4 mg (4 mg Intravenous Given 03/07/17 1747)  iopamidol (ISOVUE-300) 61 % injection (100 mLs  Contrast Given 03/07/17 1829)     Initial Impression / Assessment and Plan / ED Course  I have reviewed the triage vital signs and the nursing notes.  Pertinent labs & imaging results that were available during my care of the patient were reviewed by me and considered in my medical decision making (see chart for details).     Patient large bowel obstruction with transition point in the sigmoid colon. She does have an acute kidney injury as well. She was hydrated in the ED. I spoke with Dr.Kakrakandy with the hospitalist service who will admit the patient. I also spoke with Dr. Brantley Stage with general surgery who will consult.  Final Clinical Impressions(s) / ED Diagnoses   Final diagnoses:  Constipation, unspecified constipation type  Large bowel obstruction [facility])    New Prescriptions New Prescriptions   No medications on file     Malvin Johns, MD 03/07/17 1948

## 2017-03-07 NOTE — ED Notes (Signed)
ED Provider at bedside. 

## 2017-03-07 NOTE — ED Notes (Signed)
Pt. returned from XR. 

## 2017-03-07 NOTE — Progress Notes (Signed)
Received report from Verlin Fester, RN in ED.

## 2017-03-07 NOTE — ED Notes (Signed)
Pt transported to XR.  

## 2017-03-07 NOTE — H&P (Addendum)
History and Physical    [name] GYF:[id number] DOB: 1924-04-06 DOA: 03/07/2017  PCP: Antony Contras, MD  Patient coming from: [facility].  Chief Complaint: Abdominal pain with nausea vomiting.  HPI: [name] is a 83 y.o. female with history of TIA presents to the ER because of persistent abdominal pain with nausea vomiting. Patient had come to the ER 2 days ago with similar symptoms. Pain is mostly in the lower abdomen episodic in nature. Has been having constipation for last 4 days.   ED Course: CT of the abdomen and pelvis done shows distal large bowel obstruction concerning for neoplasm. On-call surgeon Dr. Brantley Stage has been consulted. On exam patient's abdomen appears distended.  Review of Systems: As per HPI, rest all negative.   Past Medical History:  Diagnosis Date  . Anxiety   . Arthritis   . Chronic kidney disease   . Constipation   . Movement disorder   . Sinus drainage   . Stroke [facility])    hx of ministroke in 2011   . Vision abnormalities     Past Surgical History:  Procedure Laterality Date  . EYE SURGERY     bilateral cataract surgery   . JOINT REPLACEMENT     right hip   . TOTAL HIP ARTHROPLASTY  03/08/2012   Procedure: TOTAL HIP ARTHROPLASTY ANTERIOR APPROACH;  Surgeon: Mauri Pole, MD;  Location: [facility]S;  Service: Orthopedics;  Laterality: Left;  . tumor removed right breast      benign      reports that she has never smoked. She has never used smokeless tobacco. She reports that she does not use drugs. Her alcohol history is not on file.  No Known Allergies  Family History  Problem Relation Age of Onset  . Heart disease Mother   . Dementia Mother   . COPD Father     Prior to Admission medications   Medication Sig Start Date End Date Taking? Authorizing Provider  ALPRAZolam Duanne Moron) 0.5 MG tablet Take 0.25 mg by mouth at bedtime as needed for sleep. Anxiety    [provider]  aspirin 81 MG chewable tablet Chew 81 mg by mouth  daily.    [provider]  Cholecalciferol (VITAMIN D) 2000 UNITS tablet Take 2,000 Units by mouth every morning.    [provider]  clopidogrel (PLAVIX) 75 MG tablet Take 1 tablet (75 mg total) by mouth daily. Patient not taking: Reported on 03/04/2017 11/01/14   Britt Bottom, MD  diphenhydrAMINE (BENADRYL) 25 mg capsule Take 1 capsule (25 mg total) by mouth every 6 (six) hours as needed for itching, allergies or sleep. Patient not taking: Reported on 03/04/2017 03/10/12 03/20/12  Danae Orleans, PA-C  ferrous sulfate 325 (65 FE) MG tablet Take 1 tablet (325 mg total) by mouth 3 (three) times daily after meals. Patient not taking: Reported on 03/04/2017 03/10/12 03/10/13  Danae Orleans, PA-C  folic acid-pyridoxine-cyancobalamin (FOLTX) 2.5-25-2 MG TABS tablet Take 1 tablet by mouth daily. Patient not taking: Reported on 03/04/2017 05/29/15   Britt Bottom, MD  L-Methylfolate-B12-B6-B2 (CEREFOLIN) 12-11-48-5 MG TABS Take 1 tablet by mouth daily. Patient not taking: Reported on 03/04/2017 11/05/14   Britt Bottom, MD    Physical Exam: Vitals:   03/07/17 1842 03/07/17 1843 03/07/17 1900 03/07/17 1930  BP: 134/71  136/71 130/71  Pulse:  (!) 102 (!) 104 (!) 101  Resp:      Temp:      TempSrc:  SpO2:  98% 99% 98%      Constitutional: Moderately built and nourished. Vitals:   03/07/17 1842 03/07/17 1843 03/07/17 1900 03/07/17 1930  BP: 134/71  136/71 130/71  Pulse:  (!) 102 (!) 104 (!) 101  Resp:      Temp:      TempSrc:      SpO2:  98% 99% 98%   Eyes: Anicteric. No pallor. ENMT: No discharge from the ears eyes nose and mouth. Neck: No mass felt. No neck rigidity. Respiratory: No rhonchi or crepitations. Cardiovascular: S1 and S2 heard no murmurs appreciated. Abdomen: Distended bowel sounds not appreciated. No guarding or rigidity. Musculoskeletal: No edema. Skin: No rash. Neurologic: Alert awake oriented to time place and person. Moves all  extremities. Psychiatric: Appears normal. Normal affect.   Labs on Admission: I have personally reviewed following labs and imaging studies  CBC:  Recent Labs Lab 03/04/17 1822 03/07/17 1733  WBC 6.5 5.8  NEUTROABS  --  5.0  HGB 13.5 12.9  HCT 40.4 39.2  MCV 88.8 89.3  PLT 273 267   Basic Metabolic Panel:  Recent Labs Lab 03/04/17 1822 03/07/17 1733  NA 140 139  K 3.1* 3.7  CL 106 104  CO2 23 22  GLUCOSE 125* 137*  BUN 27* 46*  CREATININE 0.93 1.65*  CALCIUM 9.6 9.6   GFR: CrCl cannot be calculated (Unknown ideal weight.). Liver Function Tests:  Recent Labs Lab 03/04/17 1822  AST 24  ALT 11*  ALKPHOS 93  BILITOT 1.3*  PROT 7.3  ALBUMIN 4.1    Recent Labs Lab 03/04/17 1822  LIPASE 30   No results for input(s): AMMONIA in the last 168 hours. Coagulation Profile: No results for input(s): INR, PROTIME in the last 168 hours. Cardiac Enzymes: No results for input(s): CKTOTAL, CKMB, CKMBINDEX, TROPONINI in the last 168 hours. BNP (last 3 results) No results for input(s): PROBNP in the last 8760 hours. HbA1C: No results for input(s): HGBA1C in the last 72 hours. CBG: No results for input(s): GLUCAP in the last 168 hours. Lipid Profile: No results for input(s): CHOL, HDL, LDLCALC, TRIG, CHOLHDL, LDLDIRECT in the last 72 hours. Thyroid Function Tests: No results for input(s): TSH, T4TOTAL, FREET4, T3FREE, THYROIDAB in the last 72 hours. Anemia Panel: No results for input(s): VITAMINB12, FOLATE, FERRITIN, TIBC, IRON, RETICCTPCT in the last 72 hours. Urine analysis:    Component Value Date/Time   COLORURINE YELLOW 03/02/2012 1125   APPEARANCEUR CLEAR 03/02/2012 1125   LABSPEC 1.016 03/02/2012 1125   PHURINE 6.0 03/02/2012 1125   GLUCOSEU NEGATIVE 03/02/2012 1125   HGBUR NEGATIVE 03/02/2012 1125   BILIRUBINUR NEGATIVE 03/02/2012 1125   KETONESUR NEGATIVE 03/02/2012 1125   PROTEINUR NEGATIVE 03/02/2012 1125   UROBILINOGEN 0.2 03/02/2012 1125    NITRITE NEGATIVE 03/02/2012 1125   LEUKOCYTESUR NEGATIVE 03/02/2012 1125   Sepsis Labs: @LABRCNTIP (procalcitonin:4,lacticidven:4) )No results found for this or any previous visit (from the past 240 hour(s)).   Radiological Exams on Admission: Ct Abdomen Pelvis W Contrast  Result Date: 03/07/2017 CLINICAL DATA:  Abdominal distension and lower abdominal pain for 2 weeks. History of chronic kidney disease. EXAM: CT ABDOMEN AND PELVIS WITH CONTRAST TECHNIQUE: Multidetector CT imaging of the abdomen and pelvis was performed using the standard protocol following bolus administration of intravenous contrast. CONTRAST:  139mL ISOVUE-300 IOPAMIDOL (ISOVUE-300) INJECTION 61% COMPARISON:  CT abdomen and pelvis March 04, 2017 FINDINGS: LOWER CHEST: Lung bases are clear. Included heart size is normal. No pericardial effusion. HEPATOBILIARY: Vicarious excretion  of contrast in the gallbladder which is mildly distended, otherwise unremarkable. Mild periportal edema, liver is otherwise normal. PANCREAS: Re- demonstration of 14 mm lobular cystic mass in uncinate of pancreas. Given patient's age of greater than 33 years old, recommend follow-up CT in 2 years. SPLEEN: 14 mm benign-appearing cyst within the spleen, unchanged. ADRENALS/URINARY TRACT: Kidneys are orthotopic, demonstrating symmetric enhancement. No nephrolithiasis, hydronephrosis or solid renal masses. The unopacified ureters are normal in course and caliber. Delayed imaging through the kidneys demonstrates symmetric prompt contrast excretion within the proximal urinary collecting system. Urinary bladder is partially distended and unremarkable. Normal adrenal glands. STOMACH/BOWEL: Large amount of retained large bowel stool with transition point in the sigmoid colon associated with wall thickening, potential mass. Limited assessment due to streak artifact from hip arthroplasties. Small bowel feces and small and large bowel air-fluid levels. Probable gastric  antral diverticulum. VASCULAR/LYMPHATIC: Aortoiliac vessels are normal in course and caliber. Mild aortic atherosclerosis. No lymphadenopathy by CT size criteria. REPRODUCTIVE: Status post hysterectomy. OTHER: Small volume ascites. No intraperitoneal free air or drainable fluid collection. MUSCULOSKELETAL: Nonacute. Streak artifact from bilateral hip total arthroplasties. Grade 1 L4-5 grade 1 L5-S1 anterolisthesis on degenerative basis. IMPRESSION: 1. Persistent distal large bowel obstruction with transition point in sigmoid colon concerning for neoplasm, less likely stricture. Recommend colonoscopy. Large volume retained large bowel stool. 2. Increasing small volume ascites. 3. Re- demonstration of 14 mm pancreatic cystic mass. Considering patient's age of greater than 43, recommend follow-up CT in 2 years. This recommendation follows ACR consensus guidelines: Management of Incidental Pancreatic Cysts: A White Paper of the ACR Incidental Findings Committee. [place] 2426;83:[id number]. Aortic Atherosclerosis (ICD10-I70.0). Electronically Signed   By: Elon Alas M.D.   On: 03/07/2017 19:09   Dg Abd Acute W/chest  Result Date: 03/07/2017 CLINICAL DATA:  Vomiting for several hours EXAM: DG ABDOMEN ACUTE W/ 1V CHEST COMPARISON:  None. FINDINGS: Cardiac shadow is within normal limits. The lungs are well aerated bilaterally. Mild aortic calcifications are seen. Scattered large and small bowel gas is noted. Mild small bowel dilatation is noted consistent with at least a partial small bowel obstruction. These changes are new from the prior exam. Degenerative changes of lumbar spine are seen. Bilateral hip replacements are noted. IMPRESSION: Ages consistent with ileus partial small bowel obstruction. Electronically Signed   By: Inez Catalina M.D.   On: 03/07/2017 17:21     Assessment/Plan Principal Problem:   Large bowel obstruction (HCC) Active Problems:   History of CVA in adulthood   Bowel  obstruction (HCC)    1. Large bowel obstruction concerning for neoplasm - Gen. surgery has been consulted. Patient will be kept nothing by mouth and IV fluids and antiemetic medications. Further recommendations per Surgery. 2. History of TIA on aspirin. 3. Acute renal failure - probably from dehydration. Hydrate and recheck metabolic panel.  I have reviewed patient's old charts and labs.   DVT prophylaxis: SCDs. Code Status: DO NOT RESUSCITATE.  Family Communication: Patient's daughter.  Disposition Plan: To be determined.  Consults called: General surgery.  Admission status: Inpatient.    Rise Patience MD [facility] Pager [phone].  If 7PM-7AM, please contact night-coverage [email or web address] Password [facility]  03/07/2017, 7:58 PM

## 2017-03-07 NOTE — ED Triage Notes (Signed)
To triage via EMS.  Pt seen at [facility] 03-04-17 for constipation, ct scan done, mag citrate given to pt to take home and take.  Pt drank all of mag citrate and vomited within 5 minutes vomited.  Pt has been vomiting since 10 pm last night,  Nausea.

## 2017-03-07 NOTE — ED Notes (Signed)
Admitting at the bedside. Will transport after assessment.

## 2017-03-07 NOTE — Consult Note (Signed)
Reason for [place] Referring Physician:Kakrakandy MD  [name] is an 83 y.o. female.  HPI: : Asked to see the patient at the request of Dr Chyrl Civatte for colonic obstruction.  3 moth hx of progressive constipation and change in stool size.  No blood in her stool and no hx of diverticulitis.  CT  Shows distal colonic obstruction.   Past Medical History:  Diagnosis Date  . Anxiety   . Arthritis   . Chronic kidney disease   . Constipation   . Movement disorder   . Sinus drainage   . Stroke [facility])    hx of ministroke in 2011   . Vision abnormalities     Past Surgical History:  Procedure Laterality Date  . EYE SURGERY     bilateral cataract surgery   . JOINT REPLACEMENT     right hip   . TOTAL HIP ARTHROPLASTY  03/08/2012   Procedure: TOTAL HIP ARTHROPLASTY ANTERIOR APPROACH;  Surgeon: Mauri Pole, MD;  Location: [facility];  Service: Orthopedics;  Laterality: Left;  . tumor removed right breast      benign     Family History  Problem Relation Age of Onset  . Heart disease Mother   . Dementia Mother   . COPD Father     Social History:  reports that she has never smoked. She has never used smokeless tobacco. She reports that she does not use drugs. Her alcohol history is not on file.  Allergies: No Known Allergies  Medications: I have reviewed the patient's current medications.  Results for orders placed or performed during the hospital encounter of 03/07/17 (from the past 48 hour(s))  Basic metabolic panel     Status: Abnormal   Collection Time: 03/07/17  5:33 PM  Result Value Ref Range   Sodium 139 135 - 145 mmol/L   Potassium 3.7 3.5 - 5.1 mmol/L   Chloride 104 101 - 111 mmol/L   CO2 22 22 - 32 mmol/L   Glucose, Bld 137 (H) 65 - 99 mg/dL   BUN 46 (H) 6 - 20 mg/dL   Creatinine, Ser 1.65 (H) 0.44 - 1.00 mg/dL   Calcium 9.6 8.9 - 10.3 mg/dL   GFR calc non Af Amer 26 (L) >60 mL/min   GFR calc Af Amer 30 (L) >60 mL/min    Comment: (NOTE) The eGFR  has been calculated using the CKD EPI equation. This calculation has not been validated in all clinical situations. eGFR's persistently <60 mL/min signify possible Chronic Kidney Disease.    Anion gap 13 5 - 15  CBC with Differential     Status: Abnormal   Collection Time: 03/07/17  5:33 PM  Result Value Ref Range   WBC 5.8 4.0 - 10.5 K/uL   RBC 4.39 3.87 - 5.11 MIL/uL   Hemoglobin 12.9 12.0 - 15.0 g/dL   HCT 39.2 36.0 - 46.0 %   MCV 89.3 78.0 - 100.0 fL   MCH 29.4 26.0 - 34.0 pg   MCHC 32.9 30.0 - 36.0 g/dL   RDW 14.5 11.5 - 15.5 %   Platelets 283 150 - 400 K/uL   Neutrophils Relative % 86 %   Neutro Abs 5.0 1.7 - 7.7 K/uL   Lymphocytes Relative 7 %   Lymphs Abs 0.4 (L) 0.7 - 4.0 K/uL   Monocytes Relative 7 %   Monocytes Absolute 0.4 0.1 - 1.0 K/uL   Eosinophils Relative 0 %   Eosinophils Absolute 0.0 0.0 - 0.7 K/uL  Basophils Relative 0 %   Basophils Absolute 0.0 0.0 - 0.1 K/uL    Ct Abdomen Pelvis W Contrast  Result Date: 03/07/2017 CLINICAL DATA:  Abdominal distension and lower abdominal pain for 2 weeks. History of chronic kidney disease. EXAM: CT ABDOMEN AND PELVIS WITH CONTRAST TECHNIQUE: Multidetector CT imaging of the abdomen and pelvis was performed using the standard protocol following bolus administration of intravenous contrast. CONTRAST:  171m ISOVUE-300 IOPAMIDOL (ISOVUE-300) INJECTION 61% COMPARISON:  CT abdomen and pelvis March 04, 2017 FINDINGS: LOWER CHEST: Lung bases are clear. Included heart size is normal. No pericardial effusion. HEPATOBILIARY: Vicarious excretion of contrast in the gallbladder which is mildly distended, otherwise unremarkable. Mild periportal edema, liver is otherwise normal. PANCREAS: Re- demonstration of 14 mm lobular cystic mass in uncinate of pancreas. Given patient's age of greater than 89years old, recommend follow-up CT in 2 years. SPLEEN: 14 mm benign-appearing cyst within the spleen, unchanged. ADRENALS/URINARY TRACT: Kidneys are  orthotopic, demonstrating symmetric enhancement. No nephrolithiasis, hydronephrosis or solid renal masses. The unopacified ureters are normal in course and caliber. Delayed imaging through the kidneys demonstrates symmetric prompt contrast excretion within the proximal urinary collecting system. Urinary bladder is partially distended and unremarkable. Normal adrenal glands. STOMACH/BOWEL: Large amount of retained large bowel stool with transition point in the sigmoid colon associated with wall thickening, potential mass. Limited assessment due to streak artifact from hip arthroplasties. Small bowel feces and small and large bowel air-fluid levels. Probable gastric antral diverticulum. VASCULAR/LYMPHATIC: Aortoiliac vessels are normal in course and caliber. Mild aortic atherosclerosis. No lymphadenopathy by CT size criteria. REPRODUCTIVE: Status post hysterectomy. OTHER: Small volume ascites. No intraperitoneal free air or drainable fluid collection. MUSCULOSKELETAL: Nonacute. Streak artifact from bilateral hip total arthroplasties. Grade 1 L4-5 grade 1 L5-S1 anterolisthesis on degenerative basis. IMPRESSION: 1. Persistent distal large bowel obstruction with transition point in sigmoid colon concerning for neoplasm, less likely stricture. Recommend colonoscopy. Large volume retained large bowel stool. 2. Increasing small volume ascites. 3. Re- demonstration of 14 mm pancreatic cystic mass. Considering patient's age of greater than 89 recommend follow-up CT in 2 years. This recommendation follows ACR consensus guidelines: Management of Incidental Pancreatic Cysts: A White Paper of the ACR Incidental Findings Committee. [id number] Aortic Atherosclerosis (ICD10-I70.0). Electronically Signed   By: CElon AlasM.D.   On: 03/07/2017 19:09   Dg Abd Acute W/chest  Result Date: 03/07/2017 CLINICAL DATA:  Vomiting for several hours EXAM: DG ABDOMEN ACUTE W/ 1V CHEST COMPARISON:  None.  FINDINGS: Cardiac shadow is within normal limits. The lungs are well aerated bilaterally. Mild aortic calcifications are seen. Scattered large and small bowel gas is noted. Mild small bowel dilatation is noted consistent with at least a partial small bowel obstruction. These changes are new from the prior exam. Degenerative changes of lumbar spine are seen. Bilateral hip replacements are noted. IMPRESSION: Ages consistent with ileus partial small bowel obstruction. Electronically Signed   By: MInez CatalinaM.D.   On: 03/07/2017 17:21    Review of Systems  Constitutional: Positive for malaise/fatigue and weight loss.  HENT: Positive for nosebleeds. Negative for hearing loss.   Eyes: Negative for blurred vision and double vision.  Respiratory: Negative for cough and hemoptysis.   Cardiovascular: Positive for chest pain. Negative for palpitations.  Gastrointestinal: Positive for abdominal pain and constipation.  Genitourinary: Negative for dysuria and urgency.  Musculoskeletal: Negative for myalgias.  Skin: Negative for itching and rash.  Neurological: Negative for dizziness.  Endo/Heme/Allergies:  Negative for environmental allergies. Does not bruise/bleed easily.  Psychiatric/Behavioral: Positive for depression. Negative for suicidal ideas.   Blood pressure 136/79, pulse (!) 104, temperature 99 F (37.2 C), temperature source Oral, resp. rate 17, height '5\' 3"'$  (1.6 m), weight 56.3 kg (124 lb 1.6 oz), SpO2 96 %. Physical Exam  Constitutional: She appears well-developed and well-nourished.  HENT:  Head: Normocephalic and atraumatic.  Eyes: Pupils are equal, round, and reactive to light. EOM are normal.  Neck: Normal range of motion. Neck supple.  Cardiovascular: Normal rate and regular rhythm.   Respiratory: Effort normal and breath sounds normal.  GI: She exhibits distension. She exhibits no mass. There is tenderness. There is no rebound and no guarding.  Musculoskeletal: Normal range of  motion.  Neurological: She is alert.  Skin: Skin is warm and dry.  Psychiatric: She has a normal mood and affect. Her behavior is normal.    Assessment/Plan: COLONIC OBSTRUCTION  Pt has no desire for colostomy  Pt and family have interest in colonic stent  for possible 1 stage operation and bowel prep   Rec GI consult If not a candidate will need resection and colostomy   Teryl Mcconaghy A. 03/07/2017, 9:23 PM

## 2017-03-07 NOTE — ED Notes (Signed)
Pt transported to CT ?

## 2017-03-08 ENCOUNTER — Encounter (HOSPITAL_COMMUNITY)
Admission: EM | Disposition: A | Discharge status: Skilled Nursing Facility | Payer: Self-pay | Source: Home / Self Care | Attending: Internal Medicine

## 2017-03-08 ENCOUNTER — Inpatient Hospital Stay (HOSPITAL_COMMUNITY): Payer: Medicare Other

## 2017-03-08 ENCOUNTER — Encounter (HOSPITAL_COMMUNITY): Payer: Self-pay | Admitting: *Deleted

## 2017-03-08 DIAGNOSIS — R112 Nausea with vomiting, unspecified: Secondary | ICD-10-CM

## 2017-03-08 DIAGNOSIS — E876 Hypokalemia: Secondary | ICD-10-CM

## 2017-03-08 DIAGNOSIS — R109 Unspecified abdominal pain: Secondary | ICD-10-CM

## 2017-03-08 DIAGNOSIS — N179 Acute kidney failure, unspecified: Secondary | ICD-10-CM

## 2017-03-08 HISTORY — PX: FLEXIBLE SIGMOIDOSCOPY: SHX5431

## 2017-03-08 LAB — CBC
HCT: 35.5 % — ABNORMAL LOW (ref 36.0–46.0)
Hemoglobin: 11.7 g/dL — ABNORMAL LOW (ref 12.0–15.0)
MCH: 29.5 pg (ref 26.0–34.0)
MCHC: 33 g/dL (ref 30.0–36.0)
MCV: 89.4 fL (ref 78.0–100.0)
PLATELETS: 228 10*3/uL (ref 150–400)
RBC: 3.97 MIL/uL (ref 3.87–5.11)
RDW: 14.7 % (ref 11.5–15.5)
WBC: 5.2 10*3/uL (ref 4.0–10.5)

## 2017-03-08 LAB — BASIC METABOLIC PANEL
Anion gap: 12 (ref 5–15)
BUN: 38 mg/dL — AB (ref 6–20)
CALCIUM: 8.6 mg/dL — AB (ref 8.9–10.3)
CO2: 20 mmol/L — ABNORMAL LOW (ref 22–32)
CREATININE: 1.17 mg/dL — AB (ref 0.44–1.00)
Chloride: 111 mmol/L (ref 101–111)
GFR calc Af Amer: 46 mL/min — ABNORMAL LOW (ref >60)
GFR, EST NON AFRICAN AMERICAN: 39 mL/min — AB (ref >60)
Glucose, Bld: 137 mg/dL — ABNORMAL HIGH (ref 65–99)
Potassium: 3 mmol/L — ABNORMAL LOW (ref 3.5–5.1)
SODIUM: 143 mmol/L (ref 135–145)

## 2017-03-08 LAB — GLUCOSE, CAPILLARY
GLUCOSE-CAPILLARY: 122 mg/dL — AB (ref 65–99)
GLUCOSE-CAPILLARY: 123 mg/dL — AB (ref 65–99)

## 2017-03-08 LAB — MAGNESIUM: Magnesium: 2.4 mg/dL (ref 1.7–2.4)

## 2017-03-08 SURGERY — SIGMOIDOSCOPY, FLEXIBLE
Anesthesia: Moderate Sedation

## 2017-03-08 SURGERY — SIGMOIDOSCOPY, FLEXIBLE

## 2017-03-08 MED ORDER — SODIUM CHLORIDE 0.9 % IV SOLN
INTRAVENOUS | Status: DC
  Administered 2017-03-08: 14:00:00 via INTRAVENOUS

## 2017-03-08 MED ORDER — POTASSIUM CHLORIDE 2 MEQ/ML IV SOLN
INTRAVENOUS | Status: AC
  Administered 2017-03-08: 09:00:00 via INTRAVENOUS
  Filled 2017-03-08: qty 1000

## 2017-03-08 MED ORDER — IOPAMIDOL (ISOVUE-300) INJECTION 61%
450.0000 mL | Freq: Once | INTRAVENOUS | Status: AC | PRN
  Administered 2017-03-08: 150 mL

## 2017-03-08 MED ORDER — FLEET ENEMA 7-19 GM/118ML RE ENEM
1.0000 | ENEMA | Freq: Once | RECTAL | Status: AC
  Administered 2017-03-08: 1 via RECTAL
  Filled 2017-03-08: qty 1

## 2017-03-08 MED ORDER — IOPAMIDOL (ISOVUE-300) INJECTION 61%
INTRAVENOUS | Status: AC
  Administered 2017-03-08: 150 mL
  Filled 2017-03-08: qty 450

## 2017-03-08 NOTE — Progress Notes (Signed)
PROGRESS NOTE    [name]  HAL:[id number] DOB: 1924/01/29 DOA: 03/07/2017 PCP: Antony Contras, MD   Chief Complaint  Patient presents with  . Constipation    Brief Narrative:  HPI on 03/07/17 by Teretha Chalupa is a 83 y.o. female with history of TIA presents to the ER because of persistent abdominal pain with nausea vomiting. Patient had come to the ER 2 days ago with similar symptoms. Pain is mostly in the lower abdomen episodic in nature. Has been having constipation for last 4 days.  Assessment & Plan   Abdominal pain with nausea/vomiting secondary to Large bowel obstruction -CT abdomen and pelvis showed persistent distal large bowel obstruction with transition point in the sigmoid colon concerning for neoplasm -Gen. surgery consulted and appreciated -Gastroenterology consult appreciated, plan for flexible sigmoidoscopy today -Continue IVF, changed to D5 1/2 -Continue pain control and antiemetics as needed   -Currently NPO  Acute kidney injury on chronic kidney disease, stage III -Suspect secondary to nausea and vomiting and poor oral intake -Creatinine 1.65 on admission, baseline 0.93 -Creatinine currently 1.17  Hypokalemia -Possibly secondary to GI losses -will replace in IVF and continue to monitor BMP -Magnesium added on morning labs, 2.4  History of TIA -was on aspirin, currently held   DVT Prophylaxis  SCDs  Code Status: Full  Family Communication: None at bedside  Disposition Plan: Admitted. Pending flexible sigmoidoscopy today  Consultants General surgery Gastroenterology  Procedures  None  Antibiotics   Anti-infectives    None      Subjective:   [name] seen and examined today.  Patient feels very thirsty today, wishes to drink water. Denies current nausea, vomiting, abdominal pain. Denies chest pain, shortness breath, dizziness or headache.  Objective:   Vitals:   03/07/17 1930 03/07/17 2000 03/07/17 2110 03/08/17 0622  BP: 130/71  131/76 136/79 120/65  Pulse: (!) 101 (!) 105 (!) 104 91  Resp:   17 16  Temp:   99 F (37.2 C) 98.2 F (36.8 C)  TempSrc:   Oral Oral  SpO2: 98% 96%  93%  Weight:   56.3 kg (124 lb 1.6 oz)   Height:   5\' 3"  (1.6 m)     Intake/Output Summary (Last 24 hours) at 03/08/17 1130 Last data filed at 03/08/17 0436  Gross per 24 hour  Intake           613.75 ml  Output                0 ml  Net           613.75 ml   Filed Weights   03/07/17 2110  Weight: 56.3 kg (124 lb 1.6 oz)    Exam  General: Well developed,   HEENT: NCAT, mucous membranes dry  Cardiovascular: S1 S2 auscultated, RRR  Respiratory: Clear to auscultation bilaterally with equal chest rise  Abdomen: Distended, nontender, +bowel sounds   Extremities: warm dry without cyanosis clubbing or edema  Neuro: AAOx3, Nonfocal  Psych: appropriate mood and affect   Data Reviewed: I have personally reviewed following labs and imaging studies  CBC:  Recent Labs Lab 03/04/17 1822 03/07/17 1733 03/08/17 0524  WBC 6.5 5.8 5.2  NEUTROABS  --  5.0  --   HGB 13.5 12.9 11.7*  HCT 40.4 39.2 35.5*  MCV 88.8 89.3 89.4  PLT 273 283 735   Basic Metabolic Panel:  Recent Labs Lab 03/04/17 1822 03/07/17 1733 03/08/17 0524  NA 140 139 143  K  3.1* 3.7 3.0*  CL 106 104 111  CO2 23 22 20*  GLUCOSE 125* 137* 137*  BUN 27* 46* 38*  CREATININE 0.93 1.65* 1.17*  CALCIUM 9.6 9.6 8.6*  MG  --   --  2.4   GFR: Estimated Creatinine Clearance: 25.9 mL/min (A) (by C-G formula based on SCr of 1.17 mg/dL (H)). Liver Function Tests:  Recent Labs Lab 03/04/17 1822  AST 24  ALT 11*  ALKPHOS 93  BILITOT 1.3*  PROT 7.3  ALBUMIN 4.1    Recent Labs Lab 03/04/17 1822  LIPASE 30   No results for input(s): AMMONIA in the last 168 hours. Coagulation Profile: No results for input(s): INR, PROTIME in the last 168 hours. Cardiac Enzymes: No results for input(s): CKTOTAL, CKMB, CKMBINDEX, TROPONINI in the last 168  hours. BNP (last 3 results) No results for input(s): PROBNP in the last 8760 hours. HbA1C: No results for input(s): HGBA1C in the last 72 hours. CBG:  Recent Labs Lab 03/08/17 0831  GLUCAP 122*   Lipid Profile: No results for input(s): CHOL, HDL, LDLCALC, TRIG, CHOLHDL, LDLDIRECT in the last 72 hours. Thyroid Function Tests: No results for input(s): TSH, T4TOTAL, FREET4, T3FREE, THYROIDAB in the last 72 hours. Anemia Panel: No results for input(s): VITAMINB12, FOLATE, FERRITIN, TIBC, IRON, RETICCTPCT in the last 72 hours. Urine analysis:    Component Value Date/Time   COLORURINE YELLOW 03/02/2012 1125   APPEARANCEUR CLEAR 03/02/2012 1125   LABSPEC 1.016 03/02/2012 1125   PHURINE 6.0 03/02/2012 1125   GLUCOSEU NEGATIVE 03/02/2012 1125   HGBUR NEGATIVE 03/02/2012 1125   BILIRUBINUR NEGATIVE 03/02/2012 1125   KETONESUR NEGATIVE 03/02/2012 1125   PROTEINUR NEGATIVE 03/02/2012 1125   UROBILINOGEN 0.2 03/02/2012 1125   NITRITE NEGATIVE 03/02/2012 1125   LEUKOCYTESUR NEGATIVE 03/02/2012 1125   Sepsis Labs: @LABRCNTIP (procalcitonin:4,lacticidven:4)  )No results found for this or any previous visit (from the past 240 hour(s)).    Radiology Studies: Ct Abdomen Pelvis W Contrast  Result Date: 03/07/2017 CLINICAL DATA:  Abdominal distension and lower abdominal pain for 2 weeks. History of chronic kidney disease. EXAM: CT ABDOMEN AND PELVIS WITH CONTRAST TECHNIQUE: Multidetector CT imaging of the abdomen and pelvis was performed using the standard protocol following bolus administration of intravenous contrast. CONTRAST:  182mL ISOVUE-300 IOPAMIDOL (ISOVUE-300) INJECTION 61% COMPARISON:  CT abdomen and pelvis March 04, 2017 FINDINGS: LOWER CHEST: Lung bases are clear. Included heart size is normal. No pericardial effusion. HEPATOBILIARY: Vicarious excretion of contrast in the gallbladder which is mildly distended, otherwise unremarkable. Mild periportal edema, liver is otherwise  normal. PANCREAS: Re- demonstration of 14 mm lobular cystic mass in uncinate of pancreas. Given patient's age of greater than 76 years old, recommend follow-up CT in 2 years. SPLEEN: 14 mm benign-appearing cyst within the spleen, unchanged. ADRENALS/URINARY TRACT: Kidneys are orthotopic, demonstrating symmetric enhancement. No nephrolithiasis, hydronephrosis or solid renal masses. The unopacified ureters are normal in course and caliber. Delayed imaging through the kidneys demonstrates symmetric prompt contrast excretion within the proximal urinary collecting system. Urinary bladder is partially distended and unremarkable. Normal adrenal glands. STOMACH/BOWEL: Large amount of retained large bowel stool with transition point in the sigmoid colon associated with wall thickening, potential mass. Limited assessment due to streak artifact from hip arthroplasties. Small bowel feces and small and large bowel air-fluid levels. Probable gastric antral diverticulum. VASCULAR/LYMPHATIC: Aortoiliac vessels are normal in course and caliber. Mild aortic atherosclerosis. No lymphadenopathy by CT size criteria. REPRODUCTIVE: Status post hysterectomy. OTHER: Small volume ascites. No intraperitoneal  free air or drainable fluid collection. MUSCULOSKELETAL: Nonacute. Streak artifact from bilateral hip total arthroplasties. Grade 1 L4-5 grade 1 L5-S1 anterolisthesis on degenerative basis. IMPRESSION: 1. Persistent distal large bowel obstruction with transition point in sigmoid colon concerning for neoplasm, less likely stricture. Recommend colonoscopy. Large volume retained large bowel stool. 2. Increasing small volume ascites. 3. Re- demonstration of 14 mm pancreatic cystic mass. Considering patient's age of greater than 38, recommend follow-up CT in 2 years. This recommendation follows ACR consensus guidelines: Management of Incidental Pancreatic Cysts: A White Paper of the ACR Incidental Findings Committee. Rafael Capo  [id number]. Aortic Atherosclerosis (ICD10-I70.0). Electronically Signed   By: Elon Alas M.D.   On: 03/07/2017 19:09   Dg Abd Acute W/chest  Result Date: 03/07/2017 CLINICAL DATA:  Vomiting for several hours EXAM: DG ABDOMEN ACUTE W/ 1V CHEST COMPARISON:  None. FINDINGS: Cardiac shadow is within normal limits. The lungs are well aerated bilaterally. Mild aortic calcifications are seen. Scattered large and small bowel gas is noted. Mild small bowel dilatation is noted consistent with at least a partial small bowel obstruction. These changes are new from the prior exam. Degenerative changes of lumbar spine are seen. Bilateral hip replacements are noted. IMPRESSION: Ages consistent with ileus partial small bowel obstruction. Electronically Signed   By: Inez Catalina M.D.   On: 03/07/2017 17:21     Scheduled Meds: . sodium phosphate  1 enema Rectal Once  . sodium phosphate  1 enema Rectal Once   Continuous Infusions: . dextrose 5 % and 0.45% NaCl 1,000 mL with potassium chloride 60 mEq infusion 75 mL/hr at 03/08/17 0912     LOS: 1 day   Time Spent in minutes   30 minutes  Rikki Smestad D.O. on 03/08/2017 at 11:30 AM  Between 7am to 7pm - Pager - [phone]  After 7pm go to [email or web address] - password [email or web address]  And look for the night coverage person covering for me after hours  [facility] Office  [phone]

## 2017-03-08 NOTE — Consult Note (Signed)
[place] Nurse wound consult note Reason for Consult: Discuss colostomy, lifestyle and self care.  Patient does not want this unless absolutely necessary.  Daughter at bedside and emotional support provided .  MD order not to mark yet, but I did evaluate abdominal plane.  No creasing noted and ideal location noted 3 cm right and 2 cm below umbilicus. Written materials left at bedside.  Patient and daughter have no further questions.  [place] team will follow and reassess once a decision is made.   Domenic Moras RN BSN [place] Pager [phone]

## 2017-03-08 NOTE — Consult Note (Signed)
Referring Provider:  Dr. Thomasene Ripple Primary Care Physician:  Antony Contras, MD Primary Gastroenterologist:  Sadie Haber primary  Reason for Consultation:   Constipation, abnormal CT scan, possible distal colonic mass  HPI: [name] is a 83 y.o. female admitted to the hospital for further evaluation of constipation, abdominal pain, nausea and vomiting. CT scan was performed which showed distal large bowel obstruction concerning for malignancy. She was seen by surgery. GI is consulted for further evaluation and possible flexible sigmoidoscopy as well as stent placement. Patient seen and examined at bedside. Patient was complaining of worsening constipation for last 3 months along with change in caliber of stool. She denied any blood in the stool. She was complaining of intermittent sharp abdominal pain . She also had few episodes of nausea and vomiting with last episode of vomiting yesterday. Complaining of abdominal distention as well. Has lost around 4 pounds of weight in last 1 month.  No previous colonoscopy. No family history of colon cancer  Past Medical History:  Diagnosis Date  . Anxiety   . Arthritis   . Chronic kidney disease   . Constipation   . Movement disorder   . Sinus drainage   . Stroke [facility])    hx of ministroke in 2011   . Vision abnormalities     Past Surgical History:  Procedure Laterality Date  . EYE SURGERY     bilateral cataract surgery   . JOINT REPLACEMENT     right hip   . TOTAL HIP ARTHROPLASTY  03/08/2012   Procedure: TOTAL HIP ARTHROPLASTY ANTERIOR APPROACH;  Surgeon: Mauri Pole, MD;  Location: [facility]S;  Service: Orthopedics;  Laterality: Left;  . tumor removed right breast      benign     Prior to Admission medications   Medication Sig Start Date End Date Taking? Authorizing Provider  ALPRAZolam Duanne Moron) 0.5 MG tablet Take 0.25 mg by mouth at bedtime as needed for sleep. Anxiety   Yes [provider]  aspirin 81 MG chewable tablet Chew 81 mg by  mouth daily.   Yes [provider]  Cholecalciferol (VITAMIN D) 2000 UNITS tablet Take 2,000 Units by mouth every morning.   Yes [provider]  diphenhydrAMINE (BENADRYL) 25 mg capsule Take 1 capsule (25 mg total) by mouth every 6 (six) hours as needed for itching, allergies or sleep. Patient not taking: Reported on 03/04/2017 03/10/12 03/20/12  Danae Orleans, PA-C  ferrous sulfate 325 (65 FE) MG tablet Take 1 tablet (325 mg total) by mouth 3 (three) times daily after meals. Patient not taking: Reported on 03/04/2017 03/10/12 03/10/13  Danae Orleans, PA-C    Scheduled Meds: Continuous Infusions: . dextrose 5 % and 0.45% NaCl 1,000 mL with potassium chloride 60 mEq infusion 75 mL/hr at 03/08/17 0912   PRN Meds:.acetaminophen **OR** acetaminophen, morphine injection, ondansetron **OR** ondansetron (ZOFRAN) IV  Allergies as of 03/07/2017  . (No Known Allergies)    Family History  Problem Relation Age of Onset  . Heart disease Mother   . Dementia Mother   . COPD Father     Social History   Social History  . Marital status: Widowed    Spouse name: N/A  . Number of children: N/A  . Years of education: N/A   Occupational History  . Not on file.   Social History Main Topics  . Smoking status: Never Smoker  . Smokeless tobacco: Never Used  . Alcohol use Not on file  . Drug use: No  .  Sexual activity: Not on file   Other Topics Concern  . Not on file   Social History Narrative  . No narrative on file    Review of Systems: Review of Systems  Constitutional: Positive for malaise/fatigue and weight loss. Negative for chills and fever.  HENT: Negative for hearing loss and tinnitus.   Eyes: Negative for blurred vision and double vision.  Respiratory: Negative for cough, hemoptysis and sputum production.   Cardiovascular: Negative for chest pain and palpitations.  Gastrointestinal: Positive for abdominal pain, constipation, nausea and vomiting. Negative for  blood in stool.  Genitourinary: Negative for dysuria and urgency.  Musculoskeletal: Negative for myalgias and neck pain.  Skin: Negative for rash.  Neurological: Negative for seizures and loss of consciousness.  Endo/Heme/Allergies: Does not bruise/bleed easily.  Psychiatric/Behavioral: Negative for hallucinations and suicidal ideas.    Physical Exam: Vital signs: Vitals:   03/07/17 2110 03/08/17 0622  BP: 136/79 120/65  Pulse: (!) 104 91  Resp: 17 16  Temp: 99 F (37.2 C) 98.2 F (36.8 C)  SpO2:  93%   Last BM Date: 03/03/17 General:   Alert,  Well-developed, well-nourished, pleasant and cooperative in NAD HEENT: Normocephalic, atraumatic, extraocular movement intact. Lungs:  Clear throughout to auscultation.   No wheezes, crackles, or rhonchi. No acute distress. Heart:  Regular rate and rhythm; no murmurs, clicks, rubs,  or gallops. Abdomen: Significantly distended with tympanic on percussion, nontender, no peritoneal signs bowel sounds present. LE: no edema. Pulses present Neurology - alert and oriented 3 Psych - mood and affect normal. Judgment and behavior normal Rectal:  Deferred  GI:  Lab Results:  Recent Labs  03/07/17 1733 03/08/17 0524  WBC 5.8 5.2  HGB 12.9 11.7*  HCT 39.2 35.5*  PLT 283 228   BMET  Recent Labs  03/07/17 1733 03/08/17 0524  NA 139 143  K 3.7 3.0*  CL 104 111  CO2 22 20*  GLUCOSE 137* 137*  BUN 46* 38*  CREATININE 1.65* 1.17*  CALCIUM 9.6 8.6*   LFT No results for input(s): PROT, ALBUMIN, AST, ALT, ALKPHOS, BILITOT, BILIDIR, IBILI in the last 72 hours. PT/INR No results for input(s): LABPROT, INR in the last 72 hours.   Studies/Results: Ct Abdomen Pelvis W Contrast  Result Date: 03/07/2017 CLINICAL DATA:  Abdominal distension and lower abdominal pain for 2 weeks. History of chronic kidney disease. EXAM: CT ABDOMEN AND PELVIS WITH CONTRAST TECHNIQUE: Multidetector CT imaging of the abdomen and pelvis was performed using  the standard protocol following bolus administration of intravenous contrast. CONTRAST:  176mL ISOVUE-300 IOPAMIDOL (ISOVUE-300) INJECTION 61% COMPARISON:  CT abdomen and pelvis March 04, 2017 FINDINGS: LOWER CHEST: Lung bases are clear. Included heart size is normal. No pericardial effusion. HEPATOBILIARY: Vicarious excretion of contrast in the gallbladder which is mildly distended, otherwise unremarkable. Mild periportal edema, liver is otherwise normal. PANCREAS: Re- demonstration of 14 mm lobular cystic mass in uncinate of pancreas. Given patient's age of greater than 89 years old, recommend follow-up CT in 2 years. SPLEEN: 14 mm benign-appearing cyst within the spleen, unchanged. ADRENALS/URINARY TRACT: Kidneys are orthotopic, demonstrating symmetric enhancement. No nephrolithiasis, hydronephrosis or solid renal masses. The unopacified ureters are normal in course and caliber. Delayed imaging through the kidneys demonstrates symmetric prompt contrast excretion within the proximal urinary collecting system. Urinary bladder is partially distended and unremarkable. Normal adrenal glands. STOMACH/BOWEL: Large amount of retained large bowel stool with transition point in the sigmoid colon associated with wall thickening, potential mass. Limited assessment  due to streak artifact from hip arthroplasties. Small bowel feces and small and large bowel air-fluid levels. Probable gastric antral diverticulum. VASCULAR/LYMPHATIC: Aortoiliac vessels are normal in course and caliber. Mild aortic atherosclerosis. No lymphadenopathy by CT size criteria. REPRODUCTIVE: Status post hysterectomy. OTHER: Small volume ascites. No intraperitoneal free air or drainable fluid collection. MUSCULOSKELETAL: Nonacute. Streak artifact from bilateral hip total arthroplasties. Grade 1 L4-5 grade 1 L5-S1 anterolisthesis on degenerative basis. IMPRESSION: 1. Persistent distal large bowel obstruction with transition point in sigmoid colon  concerning for neoplasm, less likely stricture. Recommend colonoscopy. Large volume retained large bowel stool. 2. Increasing small volume ascites. 3. Re- demonstration of 14 mm pancreatic cystic mass. Considering patient's age of greater than 44, recommend follow-up CT in 2 years. This recommendation follows ACR consensus guidelines: Management of Incidental Pancreatic Cysts: A White Paper of the ACR Incidental Findings Committee. [place] 5449;20:100-712. Aortic Atherosclerosis (ICD10-I70.0). Electronically Signed   By: Elon Alas M.D.   On: 03/07/2017 19:09   Dg Abd Acute W/chest  Result Date: 03/07/2017 CLINICAL DATA:  Vomiting for several hours EXAM: DG ABDOMEN ACUTE W/ 1V CHEST COMPARISON:  None. FINDINGS: Cardiac shadow is within normal limits. The lungs are well aerated bilaterally. Mild aortic calcifications are seen. Scattered large and small bowel gas is noted. Mild small bowel dilatation is noted consistent with at least a partial small bowel obstruction. These changes are new from the prior exam. Degenerative changes of lumbar spine are seen. Bilateral hip replacements are noted. IMPRESSION: Ages consistent with ileus partial small bowel obstruction. Electronically Signed   By: Inez Catalina M.D.   On: 03/07/2017 17:21    Impression/Plan: - Abdominal pain, nausea and vomiting, constipation with a CT scan showing distal large bowel obstruction with transition point in the sigmoid colon concerning for malignancy. - 14  mm pancreatic cystic lesion/ mass  Recommendation ----------------------- - Flexible sigmoidoscopy today for further evaluation. Risk benefits alternatives discussed with the patient and daughter. They verbalized understanding. - Plan for Colonic  stent placement will depend on today's findings - GI will follow    LOS: 1 day   Otis Brace  MD, FACP 03/08/2017, 1:47 PM  Pager [phone] If no answer or after 5 PM call [phone]

## 2017-03-08 NOTE — Progress Notes (Signed)
[facility] Progress Note    Subjective: Patient emphasized again that she would ike to try some alternative besides colostomy   Objective: Vital signs in last 24 hours: Temp:  [98.1 F (36.7 C)-99 F (37.2 C)] 98.2 F (36.8 C) (08/27 0622) Pulse Rate:  [91-108] 91 (08/27 0622) Resp:  [16-17] 16 (08/27 0622) BP: (119-145)/(65-80) 120/65 (08/27 0622) SpO2:  [93 %-99 %] 93 % (08/27 0622) Weight:  [56.3 kg (124 lb 1.6 oz)] 56.3 kg (124 lb 1.6 oz) (08/26 2110) Last BM Date: 03/03/17  Intake/Output from previous day: 08/26 0701 - 08/27 0700 In: 613.8 [I.V.:613.8] Out: -  Intake/Output this shift: No intake/output data recorded.  General: No distress.  Lungs: Clear  Abd: Distended.  Hyperactive bowell sounds.  No flatus or BM  Extremities: No chagnes  Neuro: Intact  Lab Results:  @LABLAST2 (wbc:2,hgb:2,hct:2,plt:2) BMET ) Recent Labs  03/07/17 1733 03/08/17 0524  NA 139 143  K 3.7 3.0*  CL 104 111  CO2 22 20*  GLUCOSE 137* 137*  BUN 46* 38*  CREATININE 1.65* 1.17*  CALCIUM 9.6 8.6*   PT/INR No results for input(s): LABPROT, INR in the last 72 hours. ABG No results for input(s): PHART, HCO3 in the last 72 hours.  Invalid input(s): PCO2, PO2  Studies/Results: Ct Abdomen Pelvis W Contrast  Result Date: 03/07/2017 CLINICAL DATA:  Abdominal distension and lower abdominal pain for 2 weeks. History of chronic kidney disease. EXAM: CT ABDOMEN AND PELVIS WITH CONTRAST TECHNIQUE: Multidetector CT imaging of the abdomen and pelvis was performed using the standard protocol following bolus administration of intravenous contrast. CONTRAST:  125mL ISOVUE-300 IOPAMIDOL (ISOVUE-300) INJECTION 61% COMPARISON:  CT abdomen and pelvis March 04, 2017 FINDINGS: LOWER CHEST: Lung bases are clear. Included heart size is normal. No pericardial effusion. HEPATOBILIARY: Vicarious excretion of contrast in the gallbladder which is mildly distended, otherwise unremarkable. Mild periportal  edema, liver is otherwise normal. PANCREAS: Re- demonstration of 14 mm lobular cystic mass in uncinate of pancreas. Given patient's age of greater than 39 years old, recommend follow-up CT in 2 years. SPLEEN: 14 mm benign-appearing cyst within the spleen, unchanged. ADRENALS/URINARY TRACT: Kidneys are orthotopic, demonstrating symmetric enhancement. No nephrolithiasis, hydronephrosis or solid renal masses. The unopacified ureters are normal in course and caliber. Delayed imaging through the kidneys demonstrates symmetric prompt contrast excretion within the proximal urinary collecting system. Urinary bladder is partially distended and unremarkable. Normal adrenal glands. STOMACH/BOWEL: Large amount of retained large bowel stool with transition point in the sigmoid colon associated with wall thickening, potential mass. Limited assessment due to streak artifact from hip arthroplasties. Small bowel feces and small and large bowel air-fluid levels. Probable gastric antral diverticulum. VASCULAR/LYMPHATIC: Aortoiliac vessels are normal in course and caliber. Mild aortic atherosclerosis. No lymphadenopathy by CT size criteria. REPRODUCTIVE: Status post hysterectomy. OTHER: Small volume ascites. No intraperitoneal free air or drainable fluid collection. MUSCULOSKELETAL: Nonacute. Streak artifact from bilateral hip total arthroplasties. Grade 1 L4-5 grade 1 L5-S1 anterolisthesis on degenerative basis. IMPRESSION: 1. Persistent distal large bowel obstruction with transition point in sigmoid colon concerning for neoplasm, less likely stricture. Recommend colonoscopy. Large volume retained large bowel stool. 2. Increasing small volume ascites. 3. Re- demonstration of 14 mm pancreatic cystic mass. Considering patient's age of greater than 63, recommend follow-up CT in 2 years. This recommendation follows ACR consensus guidelines: Management of Incidental Pancreatic Cysts: A White Paper of the ACR Incidental Findings Committee.  [place] 7628;31:517-616. Aortic Atherosclerosis (ICD10-I70.0). Electronically Signed   By: Sandie Ano  Bloomer M.D.   On: 03/07/2017 19:09   Dg Abd Acute W/chest  Result Date: 03/07/2017 CLINICAL DATA:  Vomiting for several hours EXAM: DG ABDOMEN ACUTE W/ 1V CHEST COMPARISON:  None. FINDINGS: Cardiac shadow is within normal limits. The lungs are well aerated bilaterally. Mild aortic calcifications are seen. Scattered large and small bowel gas is noted. Mild small bowel dilatation is noted consistent with at least a partial small bowel obstruction. These changes are new from the prior exam. Degenerative changes of lumbar spine are seen. Bilateral hip replacements are noted. IMPRESSION: Ages consistent with ileus partial small bowel obstruction. Electronically Signed   By: Inez Catalina M.D.   On: 03/07/2017 17:21    Anti-infectives: Anti-infectives    None      Assessment/Plan: s/p  Would strongly suggest GI service consultation to consider at least a flexible sigmoidoscopy and possible a stent.  Patient does not want a colostomy  LOS: 1 day   Kathryne Eriksson. Dahlia Bailiff, MD, FACS [phone] [facility] Surgery 03/08/2017

## 2017-03-08 NOTE — Progress Notes (Signed)
    CC:  Abdominal pain, constipation,  Nausea and vomiting  Subjective: She is really thirsty.  Awaiting sigmoidoscopy by GI.  Distended but not having overt pain, no nausea or vomiting.    Objective: Vital signs in last 24 hours: Temp:  [98.1 F (36.7 C)-99 F (37.2 C)] 98.2 F (36.8 C) (08/27 0622) Pulse Rate:  [91-108] 91 (08/27 0622) Resp:  [16-17] 16 (08/27 0622) BP: (119-145)/(65-80) 120/65 (08/27 0622) SpO2:  [93 %-99 %] 93 % (08/27 0622) Weight:  [56.3 kg (124 lb 1.6 oz)] 56.3 kg (124 lb 1.6 oz) (08/26 2110) Last BM Date: 03/03/17 CT:  Large amount of retained stool, with "potential mass"  Small bowel fecalization, and possible gastric antral diverticulum. Ascites, 14 mm pancreatic cystic mass IV 613 Afebrile, VSS K+ 3.0 Creatinine is improving CBC OK    Intake/Output from previous day: 08/26 0701 - 08/27 0700 In: 613.8 [I.V.:613.8] Out: -  Intake/Output this shift: No intake/output data recorded.  General appearance: alert, cooperative and no distress GI: distended, not very tender, more sore from the distension  Lab Results:   Recent Labs  03/07/17 1733 03/08/17 0524  WBC 5.8 5.2  HGB 12.9 11.7*  HCT 39.2 35.5*  PLT 283 228    BMET  Recent Labs  03/07/17 1733 03/08/17 0524  NA 139 143  K 3.7 3.0*  CL 104 111  CO2 22 20*  GLUCOSE 137* 137*  BUN 46* 38*  CREATININE 1.65* 1.17*  CALCIUM 9.6 8.6*   PT/INR No results for input(s): LABPROT, INR in the last 72 hours.   Recent Labs Lab 03/04/17 1822  AST 24  ALT 11*  ALKPHOS 93  BILITOT 1.3*  PROT 7.3  ALBUMIN 4.1     Lipase     Component Value Date/Time   LIPASE 30 03/04/2017 1822     Medications: . sodium phosphate  1 enema Rectal Once  . sodium phosphate  1 enema Rectal Once   . dextrose 5 % and 0.45% NaCl 1,000 mL with potassium chloride 60 mEq infusion 75 mL/hr at 03/08/17 1610   Anti-infectives    None       Assessment/Plan Abdominal pain, constipation, nausea  and vomiting Chronic renal disease Chronic consipation Movement disorder Hx of CVA Bilateral hip replacements FEN:  IV fluids ID:  None DVT:  SCD   Plan:  GI to attempt Flex sigmoidoscopy today.     LOS: 1 day    Abdulloh Ullom 03/08/2017 [phone]

## 2017-03-08 NOTE — Op Note (Signed)
[facility] Patient Name: [name] Procedure Date : 03/08/2017 MRN: [id number] Attending MD: Otis Brace , MD Date of Birth: Jan 12, 1924 CSN: [id number] Age: 83 Admit Type: Inpatient Procedure:                Flexible Sigmoidoscopy Indications:              Abnormal CT of the GI tract Providers:                Otis Brace, MD, Cleda Daub, RN, Elspeth Cho Tech., Technician Referring MD:              Medicines:                None Complications:            No immediate complications. Estimated Blood Loss:     Estimated blood loss was minimal. Procedure:                Pre-Anesthesia Assessment:                           - Prior to the procedure, a History and Physical                            was performed, and patient medications and                            allergies were reviewed. The patient's tolerance of                            previous anesthesia was also reviewed. The risks                            and benefits of the procedure and the sedation                            options and risks were discussed with the patient.                            All questions were answered, and informed consent                            was obtained. Prior Anticoagulants: The patient has                            taken no previous anticoagulant or antiplatelet                            agents. After reviewing the risks and benefits, the                            patient was deemed in satisfactory condition to  undergo the procedure.                           - Prior to the procedure, no anesthesia or sedation                            was planned.                           After obtaining informed consent, the scope was                            passed under direct vision. The EC-[id number] ([id number])                            scope was introduced through the anus and advanced          to the 20 to 25 cm from the anal verge. The                            flexible sigmoidoscopy was technically difficult                            and complex due to restricted mobility of the                            colon. adult EGD scope was subsequently used                            without advancement of scope beyond 25 cm from anal                            verge The quality of the bowel preparation was good. Scope In: Scope Out: Findings:      The perianal exam findings include non-thrombosed external hemorrhoids.      There was significant narrowing at 20-25 cm from anal verge at       rectosigmoid junction which prevented advancement of the pediatric       colonoscope. Subsequently EGD scope was used but still I was not able to       advance scope beyond this point      there was mild granularity at this area and biopsy was performed. I did       not see any definite mass but again I was not able to advance scope       beyound 20-25 cm from anal verge. Impression:               - Non-thrombosed external hemorrhoids found on                            perianal exam.                           - No specimens collected. Recommendation:           - Return patient to hospital ward for ongoing care.                           -  Perform a barium enema at appointment to be                            scheduled. Procedure Code(s):        --- Professional ---                           [id number], Sigmoidoscopy, flexible; diagnostic,                            including collection of specimen(s) by brushing or                            washing, when performed (separate procedure) Diagnosis Code(s):        --- Professional ---                           K64.4, Residual hemorrhoidal skin tags                           R93.3, Abnormal findings on diagnostic imaging of                            other parts of digestive tract CPT copyright 2016 [employer] Association. All rights  reserved. The codes documented in this report are preliminary and upon coder review may  be revised to meet current compliance requirements. Otis Brace, MD Otis Brace, MD 03/08/2017 3:12:10 PM Number of Addenda: 0

## 2017-03-08 NOTE — Brief Op Note (Signed)
03/07/2017 - 03/08/2017  3:20 PM  PATIENT:  [name]  83 y.o. female  PRE-OPERATIVE DIAGNOSIS:  Abnormal CT  POST-OPERATIVE DIAGNOSIS:  no sedation recto sigmoid colon bx ,colon narrowing  PROCEDURE:  Procedure(s): FLEXIBLE SIGMOIDOSCOPY (N/A)  SURGEON:  Surgeon(s) and Role:    * Yoseline Andersson, MD - Primary  Findings/recommendations --------------------------------- - Not able to advance scope beyond rectosigmoid area at 20-25 centimeter because of significant  narrowing  - EGD scope was used without any success. Abnormal appearing mucosa/ granularity noted at rectosigmoid junction. Biopsy was taken - barium enema for further evaluation. - Nothing by mouth for now. Okay to have sips of water to moist mouth. -GI will follow     Otis Brace MD, [place] 03/08/2017, 3:25 PM  Pager [phone]  If no answer or after 5 PM call [phone]

## 2017-03-08 NOTE — Progress Notes (Signed)
GI--PRELIMINARY NOTE  Have been asked to see pt regarding possible distal colonic obstructing neoplasm.  Might be able to do flex sig later today--have ordered 2 Fleet enemas in the meantime.  Dr. Alessandra Bevels to see for [place] later today.  Cleotis Nipper, M.D. Pager [phone] If no answer or after 5 PM call [phone]

## 2017-03-09 ENCOUNTER — Inpatient Hospital Stay (HOSPITAL_COMMUNITY): Payer: Medicare Other | Admitting: Anesthesiology

## 2017-03-09 ENCOUNTER — Encounter (HOSPITAL_COMMUNITY)
Admission: EM | Disposition: A | Discharge status: Skilled Nursing Facility | Payer: Self-pay | Source: Home / Self Care | Attending: Internal Medicine

## 2017-03-09 ENCOUNTER — Encounter (HOSPITAL_COMMUNITY): Payer: Self-pay | Admitting: Gastroenterology

## 2017-03-09 DIAGNOSIS — K5900 Constipation, unspecified: Secondary | ICD-10-CM

## 2017-03-09 HISTORY — PX: COLOSTOMY: SHX63

## 2017-03-09 HISTORY — PX: COLECTOMY WITH COLOSTOMY CREATION/HARTMANN PROCEDURE: SHX6598

## 2017-03-09 LAB — CBC
HEMATOCRIT: 38.1 % (ref 36.0–46.0)
Hemoglobin: 12.4 g/dL (ref 12.0–15.0)
MCH: 29.5 pg (ref 26.0–34.0)
MCHC: 32.5 g/dL (ref 30.0–36.0)
MCV: 90.5 fL (ref 78.0–100.0)
Platelets: 233 10*3/uL (ref 150–400)
RBC: 4.21 MIL/uL (ref 3.87–5.11)
RDW: 14.6 % (ref 11.5–15.5)
WBC: 3.6 10*3/uL — ABNORMAL LOW (ref 4.0–10.5)

## 2017-03-09 LAB — BASIC METABOLIC PANEL
Anion gap: 10 (ref 5–15)
BUN: 31 mg/dL — ABNORMAL HIGH (ref 6–20)
CALCIUM: 8.7 mg/dL — AB (ref 8.9–10.3)
CO2: 23 mmol/L (ref 22–32)
CREATININE: 0.99 mg/dL (ref 0.44–1.00)
Chloride: 112 mmol/L — ABNORMAL HIGH (ref 101–111)
GFR calc non Af Amer: 48 mL/min — ABNORMAL LOW (ref >60)
GFR, EST AFRICAN AMERICAN: 56 mL/min — AB (ref >60)
Glucose, Bld: 118 mg/dL — ABNORMAL HIGH (ref 65–99)
Potassium: 3 mmol/L — ABNORMAL LOW (ref 3.5–5.1)
Sodium: 145 mmol/L (ref 135–145)

## 2017-03-09 LAB — ABO/RH: ABO/RH(D): O POS

## 2017-03-09 LAB — GLUCOSE, CAPILLARY
GLUCOSE-CAPILLARY: 111 mg/dL — AB (ref 65–99)
Glucose-Capillary: 111 mg/dL — ABNORMAL HIGH (ref 65–99)

## 2017-03-09 LAB — MRSA PCR SCREENING: MRSA BY PCR: NEGATIVE

## 2017-03-09 LAB — TYPE AND SCREEN
ABO/RH(D): O POS
Antibody Screen: NEGATIVE

## 2017-03-09 SURGERY — COLECTOMY, WITH COLOSTOMY CREATION
Anesthesia: General | Site: Abdomen

## 2017-03-09 MED ORDER — ONDANSETRON HCL 4 MG/2ML IJ SOLN
INTRAMUSCULAR | Status: DC | PRN
  Administered 2017-03-09: 4 mg via INTRAVENOUS

## 2017-03-09 MED ORDER — DEXAMETHASONE SODIUM PHOSPHATE 10 MG/ML IJ SOLN
INTRAMUSCULAR | Status: DC | PRN
  Administered 2017-03-09: 5 mg via INTRAVENOUS

## 2017-03-09 MED ORDER — LACTATED RINGERS IV SOLN
INTRAVENOUS | Status: DC
  Administered 2017-03-09 (×2): via INTRAVENOUS

## 2017-03-09 MED ORDER — ROCURONIUM BROMIDE 100 MG/10ML IV SOLN
INTRAVENOUS | Status: DC | PRN
  Administered 2017-03-09: 40 mg via INTRAVENOUS
  Administered 2017-03-09: 10 mg via INTRAVENOUS

## 2017-03-09 MED ORDER — ACETAMINOPHEN 160 MG/5ML PO SOLN: 325.0000 mg | ORAL | Status: DC | PRN

## 2017-03-09 MED ORDER — FAMOTIDINE IN NACL 20-0.9 MG/50ML-% IV SOLN
20.0000 mg | Freq: Two times a day (BID) | INTRAVENOUS | Status: DC
  Administered 2017-03-09 – 2017-03-11 (×4): 20 mg via INTRAVENOUS
  Filled 2017-03-09 (×4): qty 50

## 2017-03-09 MED ORDER — ACETAMINOPHEN 325 MG PO TABS: 325.0000 mg | ORAL_TABLET | ORAL | Status: DC | PRN

## 2017-03-09 MED ORDER — LIDOCAINE HCL (CARDIAC) 20 MG/ML IV SOLN
INTRAVENOUS | Status: DC | PRN
  Administered 2017-03-09: 50 mg via INTRATRACHEAL

## 2017-03-09 MED ORDER — PROPOFOL 10 MG/ML IV BOLUS
INTRAVENOUS | Status: DC | PRN
  Administered 2017-03-09: 90 mg via INTRAVENOUS

## 2017-03-09 MED ORDER — DEXAMETHASONE SODIUM PHOSPHATE 10 MG/ML IJ SOLN
INTRAMUSCULAR | Status: AC
  Filled 2017-03-09: qty 2

## 2017-03-09 MED ORDER — PROPOFOL 10 MG/ML IV BOLUS
INTRAVENOUS | Status: AC
  Filled 2017-03-09: qty 20

## 2017-03-09 MED ORDER — POTASSIUM CHLORIDE 2 MEQ/ML IV SOLN
INTRAVENOUS | Status: DC
  Administered 2017-03-09 – 2017-03-11 (×5): via INTRAVENOUS
  Filled 2017-03-09 (×8): qty 1000

## 2017-03-09 MED ORDER — CHLORHEXIDINE GLUCONATE CLOTH 2 % EX PADS
6.0000 | MEDICATED_PAD | Freq: Once | CUTANEOUS | Status: AC
  Administered 2017-03-09: 6 via TOPICAL

## 2017-03-09 MED ORDER — ROCURONIUM BROMIDE 10 MG/ML (PF) SYRINGE
PREFILLED_SYRINGE | INTRAVENOUS | Status: AC
  Filled 2017-03-09: qty 5

## 2017-03-09 MED ORDER — SODIUM CHLORIDE 0.9 % IV SOLN: Freq: Once | INTRAVENOUS | Status: DC

## 2017-03-09 MED ORDER — FENTANYL CITRATE (PF) 100 MCG/2ML IJ SOLN
INTRAMUSCULAR | Status: AC
  Administered 2017-03-09: 50 ug via INTRAVENOUS
  Filled 2017-03-09: qty 2

## 2017-03-09 MED ORDER — FENTANYL CITRATE (PF) 250 MCG/5ML IJ SOLN
INTRAMUSCULAR | Status: DC | PRN
  Administered 2017-03-09: 100 ug via INTRAVENOUS
  Administered 2017-03-09: 50 ug via INTRAVENOUS
  Administered 2017-03-09: 100 ug via INTRAVENOUS

## 2017-03-09 MED ORDER — PHENYLEPHRINE 40 MCG/ML (10ML) SYRINGE FOR IV PUSH (FOR BLOOD PRESSURE SUPPORT)
PREFILLED_SYRINGE | INTRAVENOUS | Status: AC
  Filled 2017-03-09: qty 10

## 2017-03-09 MED ORDER — DEXTROSE 5 % IV SOLN
INTRAVENOUS | Status: DC | PRN
  Administered 2017-03-09: 35 ug/min via INTRAVENOUS

## 2017-03-09 MED ORDER — DEXTROSE 5 % IV SOLN
2.0000 g | Freq: Two times a day (BID) | INTRAVENOUS | Status: DC
  Administered 2017-03-09: 2 g via INTRAVENOUS
  Filled 2017-03-09: qty 2

## 2017-03-09 MED ORDER — FENTANYL CITRATE (PF) 250 MCG/5ML IJ SOLN
INTRAMUSCULAR | Status: AC
  Filled 2017-03-09: qty 5

## 2017-03-09 MED ORDER — ONDANSETRON HCL 4 MG/2ML IJ SOLN
INTRAMUSCULAR | Status: AC
  Filled 2017-03-09: qty 6

## 2017-03-09 MED ORDER — LIDOCAINE 2% (20 MG/ML) 5 ML SYRINGE
INTRAMUSCULAR | Status: AC
  Filled 2017-03-09: qty 15

## 2017-03-09 MED ORDER — MORPHINE SULFATE (PF) 4 MG/ML IV SOLN
1.0000 mg | INTRAVENOUS | Status: DC | PRN
  Administered 2017-03-09 – 2017-03-10 (×2): 2 mg via INTRAVENOUS
  Filled 2017-03-09 (×2): qty 1

## 2017-03-09 MED ORDER — ACETAMINOPHEN 10 MG/ML IV SOLN
1000.0000 mg | Freq: Four times a day (QID) | INTRAVENOUS | Status: DC
  Administered 2017-03-09 – 2017-03-10 (×3): 1000 mg via INTRAVENOUS
  Filled 2017-03-09 (×5): qty 100

## 2017-03-09 MED ORDER — SUGAMMADEX SODIUM 200 MG/2ML IV SOLN
INTRAVENOUS | Status: DC | PRN
  Administered 2017-03-09: 100 mg via INTRAVENOUS

## 2017-03-09 MED ORDER — SUGAMMADEX SODIUM 200 MG/2ML IV SOLN
INTRAVENOUS | Status: AC
  Filled 2017-03-09: qty 2

## 2017-03-09 MED ORDER — FENTANYL CITRATE (PF) 100 MCG/2ML IJ SOLN
25.0000 ug | INTRAMUSCULAR | Status: DC | PRN
  Administered 2017-03-09 (×3): 50 ug via INTRAVENOUS

## 2017-03-09 MED ORDER — 0.9 % SODIUM CHLORIDE (POUR BTL) OPTIME
TOPICAL | Status: DC | PRN
  Administered 2017-03-09 (×2): 1000 mL

## 2017-03-09 SURGICAL SUPPLY — 56 items
BLADE CLIPPER SURG (BLADE) IMPLANT
CANISTER SUCT 3000ML PPV (MISCELLANEOUS) ×4 IMPLANT
CHLORAPREP W/TINT 26ML (MISCELLANEOUS) ×4 IMPLANT
COVER MAYO STAND STRL (DRAPES) ×4 IMPLANT
COVER SURGICAL LIGHT HANDLE (MISCELLANEOUS) ×4 IMPLANT
DRAPE HALF SHEET 40X57 (DRAPES) ×4 IMPLANT
DRAPE LAPAROSCOPIC ABDOMINAL (DRAPES) ×4 IMPLANT
DRAPE UTILITY XL STRL (DRAPES) ×10 IMPLANT
DRAPE WARM FLUID 44X44 (DRAPE) ×4 IMPLANT
DRSG OPSITE POSTOP 4X10 (GAUZE/BANDAGES/DRESSINGS) ×2 IMPLANT
DRSG OPSITE POSTOP 4X8 (GAUZE/BANDAGES/DRESSINGS) IMPLANT
ELECT BLADE 6.5 EXT (BLADE) ×2 IMPLANT
ELECT CAUTERY BLADE 6.4 (BLADE) ×8 IMPLANT
ELECT REM PT RETURN 9FT ADLT (ELECTROSURGICAL) ×4
ELECTRODE REM PT RTRN 9FT ADLT (ELECTROSURGICAL) ×2 IMPLANT
GLOVE BIO SURGEON STRL SZ7.5 (GLOVE) ×2 IMPLANT
GLOVE BIOGEL PI IND STRL 8 (GLOVE) ×4 IMPLANT
GLOVE BIOGEL PI INDICATOR 8 (GLOVE) ×4
GLOVE ECLIPSE 7.5 STRL STRAW (GLOVE) ×8 IMPLANT
GOWN STRL REUS W/ TWL LRG LVL3 (GOWN DISPOSABLE) ×12 IMPLANT
GOWN STRL REUS W/TWL LRG LVL3 (GOWN DISPOSABLE) ×12
KIT BASIN OR (CUSTOM PROCEDURE TRAY) ×4 IMPLANT
KIT OSTOMY DRAINABLE 2.75 STR (WOUND CARE) ×2 IMPLANT
KIT ROOM TURNOVER OR (KITS) ×4 IMPLANT
LEGGING LITHOTOMY PAIR STRL (DRAPES) IMPLANT
LIGASURE IMPACT 36 18CM CVD LR (INSTRUMENTS) ×2 IMPLANT
NS IRRIG 1000ML POUR BTL (IV SOLUTION) ×8 IMPLANT
OSTOMY BRIDGE 2 1/2 (MISCELLANEOUS) ×2 IMPLANT
PACK GENERAL/GYN (CUSTOM PROCEDURE TRAY) ×4 IMPLANT
PAD ARMBOARD 7.5X6 YLW CONV (MISCELLANEOUS) ×6 IMPLANT
PENCIL BUTTON HOLSTER BLD 10FT (ELECTRODE) ×2 IMPLANT
SPECIMEN JAR X LARGE (MISCELLANEOUS) ×2 IMPLANT
SPONGE LAP 18X18 X RAY DECT (DISPOSABLE) IMPLANT
STAPLER VISISTAT 35W (STAPLE) ×4 IMPLANT
SUCTION POOLE TIP (SUCTIONS) ×6 IMPLANT
SURGILUBE 2OZ TUBE FLIPTOP (MISCELLANEOUS) IMPLANT
SUT PDS AB 1 TP1 96 (SUTURE) ×8 IMPLANT
SUT PROLENE 2 0 CT2 30 (SUTURE) IMPLANT
SUT PROLENE 2 0 KS (SUTURE) IMPLANT
SUT SILK 2 0 SH CR/8 (SUTURE) ×4 IMPLANT
SUT SILK 2 0 TIES 10X30 (SUTURE) ×2 IMPLANT
SUT SILK 3 0 SH CR/8 (SUTURE) ×4 IMPLANT
SUT SILK 3 0 TIES 10X30 (SUTURE) ×2 IMPLANT
SUT VIC AB 3-0 SH 27 (SUTURE)
SUT VIC AB 3-0 SH 27X BRD (SUTURE) ×4 IMPLANT
SUT VIC AB 3-0 SH 8-18 (SUTURE) ×2 IMPLANT
SYR BULB IRRIGATION 50ML (SYRINGE) ×2 IMPLANT
TAPE UMBILICAL COTTON 1/8X30 (MISCELLANEOUS) ×2 IMPLANT
TOWEL OR 17X26 10 PK STRL BLUE (TOWEL DISPOSABLE) ×8 IMPLANT
TRAY FOLEY W/METER SILVER 14FR (SET/KITS/TRAYS/PACK) IMPLANT
TRAY PROCTOSCOPIC FIBER OPTIC (SET/KITS/TRAYS/PACK) IMPLANT
TUBE CONNECTING 12'X1/4 (SUCTIONS)
TUBE CONNECTING 12X1/4 (SUCTIONS) ×2 IMPLANT
UNDERPAD 30X30 (UNDERPADS AND DIAPERS) IMPLANT
WATER STERILE IRR 1000ML POUR (IV SOLUTION) ×2 IMPLANT
YANKAUER SUCT BULB TIP NO VENT (SUCTIONS) ×4 IMPLANT

## 2017-03-09 NOTE — Transfer of Care (Signed)
Immediate Anesthesia Transfer of Care Note  Patient: [name]  Procedure(s) Performed: Procedure(s): EXPLORATORY LAPAROTOMY WITH FROZEN SECTION (N/A) LOOP COLOSTOMY  Patient Location: PACU  Anesthesia Type:General  Level of Consciousness: drowsy and patient cooperative  Airway & Oxygen Therapy: Patient Spontanous Breathing and Patient connected to nasal cannula oxygen  Post-op Assessment: Report given to RN, Post -op Vital signs reviewed and stable and Patient moving all extremities  Post vital signs: Reviewed and stable  Last Vitals:  Vitals:   03/09/17 0512 03/09/17 1223  BP: 134/69 (!) 149/80  Pulse: (!) 101 99  Resp: 18 18  Temp: 36.8 C 36.4 C  SpO2: 93% 94%    Last Pain:  Vitals:   03/09/17 1223  TempSrc: Oral  PainSc:          Complications: No apparent anesthesia complications

## 2017-03-09 NOTE — Progress Notes (Signed)
PROGRESS NOTE    [name]  PJA:[id number] DOB: [birth year] DOA: 03/07/2017 PCP: Antony Contras, MD   Chief Complaint  Patient presents with  . Constipation    Brief Narrative:  HPI on 03/07/17 by Dr. Gean Birchwood  [name] is a 83 y.o. female with history of TIA presents to the ER because of persistent abdominal pain with nausea vomiting. Patient had come to the ER 2 days ago with similar symptoms. Pain is mostly in the lower abdomen episodic in nature. Has been having constipation for last 4 days.   Assessment & Plan   Abdominal pain with nausea/vomiting secondary to Large bowel obstruction -CT abdomen and pelvis showed persistent distal large bowel obstruction with transition point in the sigmoid colon concerning for neoplasm -Gen. surgery consulted and appreciated -Gastroenterology consult appreciated, s/p Flexible sigmoidoscopy: Nonthrombosed external hemorrhoids found on. On exam. No specimens collected. Significant narrowing at 20-25 cm from anal verge at rectosigmoid junction which with advancement of the pediatric colonoscope. Sequelae EGD scope was used but still unable to advance scope. -Continue IVF, changed to D5 1/2 -Continue pain control and antiemetics as needed   -Currently NPO -Pending further recommendations from general surgery- likely colostomy with possible colectomy later today  Acute kidney injury on chronic kidney disease, stage III -Suspect secondary to nausea and vomiting and poor oral intake -Creatinine 1.65 on admission, baseline 0.93 -Creatinine currently 0.99 -Continue to monitor BMP  Hypokalemia -Possibly secondary to GI losses -Continue to replace in IV fluid, monitor BMP   History of TIA -was on aspirin, currently held   DVT Prophylaxis  SCDs  Code Status: DO NOT RESUSCITATE (discussed with patient, she states she has a DO NOT RESUSCITATE form and does not want any type of resuscitation)  Family Communication: None at  bedside  Disposition Plan: Admitted. Pending surgery later today.  Consultants General surgery Gastroenterology  Procedures  Flexible sigmoidoscopy  Antibiotics   Anti-infectives    Start     Dose/Rate Route Frequency Ordered Stop   03/09/17 1230  [MAR Hold]  cefoTEtan (CEFOTAN) 2 g in dextrose 5 % 50 mL IVPB     (MAR Hold since 03/09/17 1242)   2 g 100 mL/hr over 30 Minutes Intravenous Every 12 hours 03/09/17 1122        Subjective:   [name] seen and examined today.  Continues to feel very thirsty. Continues to have abdominal distention. Denies any gas or bowel movement. Denies chest pain, shortness of breath, nausea or vomiting, dizziness or headache. Patient states if she supposed to have surgery would like to get surgery over with.  Objective:   Vitals:   03/08/17 1505 03/08/17 2224 03/09/17 0512 03/09/17 1223  BP: (!) 157/92 113/80 134/69 (!) 149/80  Pulse: 88 (!) 109 (!) 101 99  Resp: 14 18 18 18   Temp:  98.1 F (36.7 C) 98.2 F (36.8 C) 97.6 F (36.4 C)  TempSrc:  Oral Oral Oral  SpO2: 98% 92% 93% 94%  Weight:      Height:        Intake/Output Summary (Last 24 hours) at 03/09/17 1406 Last data filed at 03/08/17 1727  Gross per 24 hour  Intake           718.75 ml  Output                0 ml  Net           718.75 ml   Filed Weights   03/07/17 2110  03/08/17 1415  Weight: 56.3 kg (124 lb 1.6 oz) 56.2 kg (123 lb 15.7 oz)   Exam  General: Well developed, well nourished, NAD, appears stated age  3: NCAT, mucous membranes moist.   Cardiovascular: S1 S2 auscultated, RRR  Respiratory: Clear to auscultation bilaterally with equal chest rise  Abdomen: Soft, nontender, nondistended, + bowel sounds  Extremities: warm dry without cyanosis clubbing or edema  Neuro: AAOx3, nonfocal  Psych: Appropriate mood and affect, very pleasant  Data Reviewed: I have personally reviewed following labs and imaging studies  CBC:  Recent Labs Lab  03/04/17 1822 03/07/17 1733 03/08/17 0524 03/09/17 0258  WBC 6.5 5.8 5.2 3.6*  NEUTROABS  --  5.0  --   --   HGB 13.5 12.9 11.7* 12.4  HCT 40.4 39.2 35.5* 38.1  MCV 88.8 89.3 89.4 90.5  PLT 273 283 228 588   Basic Metabolic Panel:  Recent Labs Lab 03/04/17 1822 03/07/17 1733 03/08/17 0524 03/09/17 0258  NA 140 139 143 145  K 3.1* 3.7 3.0* 3.0*  CL 106 104 111 112*  CO2 23 22 20* 23  GLUCOSE 125* 137* 137* 118*  BUN 27* 46* 38* 31*  CREATININE 0.93 1.65* 1.17* 0.99  CALCIUM 9.6 9.6 8.6* 8.7*  MG  --   --  2.4  --    GFR: Estimated Creatinine Clearance: 30.6 mL/min (by C-G formula based on SCr of 0.99 mg/dL). Liver Function Tests:  Recent Labs Lab 03/04/17 1822  AST 24  ALT 11*  ALKPHOS 93  BILITOT 1.3*  PROT 7.3  ALBUMIN 4.1    Recent Labs Lab 03/04/17 1822  LIPASE 30   No results for input(s): AMMONIA in the last 168 hours. Coagulation Profile: No results for input(s): INR, PROTIME in the last 168 hours. Cardiac Enzymes: No results for input(s): CKTOTAL, CKMB, CKMBINDEX, TROPONINI in the last 168 hours. BNP (last 3 results) No results for input(s): PROBNP in the last 8760 hours. HbA1C: No results for input(s): HGBA1C in the last 72 hours. CBG:  Recent Labs Lab 03/08/17 0831 03/08/17 1943 03/09/17 0333 03/09/17 0806  GLUCAP 122* 123* 111* 111*   Lipid Profile: No results for input(s): CHOL, HDL, LDLCALC, TRIG, CHOLHDL, LDLDIRECT in the last 72 hours. Thyroid Function Tests: No results for input(s): TSH, T4TOTAL, FREET4, T3FREE, THYROIDAB in the last 72 hours. Anemia Panel: No results for input(s): VITAMINB12, FOLATE, FERRITIN, TIBC, IRON, RETICCTPCT in the last 72 hours. Urine analysis:    Component Value Date/Time   COLORURINE YELLOW 03/02/2012 1125   APPEARANCEUR CLEAR 03/02/2012 1125   LABSPEC 1.016 03/02/2012 1125   PHURINE 6.0 03/02/2012 1125   GLUCOSEU NEGATIVE 03/02/2012 1125   HGBUR NEGATIVE 03/02/2012 1125   BILIRUBINUR  NEGATIVE 03/02/2012 1125   KETONESUR NEGATIVE 03/02/2012 1125   PROTEINUR NEGATIVE 03/02/2012 1125   UROBILINOGEN 0.2 03/02/2012 1125   NITRITE NEGATIVE 03/02/2012 1125   LEUKOCYTESUR NEGATIVE 03/02/2012 1125   Sepsis Labs: @LABRCNTIP (procalcitonin:4,lacticidven:4)  ) Recent Results (from the past 240 hour(s))  MRSA PCR Screening     Status: None   Collection Time: 03/09/17 11:52 AM  Result Value Ref Range Status   MRSA by PCR NEGATIVE NEGATIVE Final    Comment:        The GeneXpert MRSA Assay (FDA approved for NASAL specimens only), is one component of a comprehensive MRSA colonization surveillance program. It is not intended to diagnose MRSA infection nor to guide or monitor treatment for MRSA infections.       Radiology  Studies: Ct Abdomen Pelvis W Contrast  Result Date: 03/07/2017 CLINICAL DATA:  Abdominal distension and lower abdominal pain for 2 weeks. History of chronic kidney disease. EXAM: CT ABDOMEN AND PELVIS WITH CONTRAST TECHNIQUE: Multidetector CT imaging of the abdomen and pelvis was performed using the standard protocol following bolus administration of intravenous contrast. CONTRAST:  158mL ISOVUE-300 IOPAMIDOL (ISOVUE-300) INJECTION 61% COMPARISON:  CT abdomen and pelvis March 04, 2017 FINDINGS: LOWER CHEST: Lung bases are clear. Included heart size is normal. No pericardial effusion. HEPATOBILIARY: Vicarious excretion of contrast in the gallbladder which is mildly distended, otherwise unremarkable. Mild periportal edema, liver is otherwise normal. PANCREAS: Re- demonstration of 14 mm lobular cystic mass in uncinate of pancreas. Given patient's age of greater than 61 years old, recommend follow-up CT in 2 years. SPLEEN: 14 mm benign-appearing cyst within the spleen, unchanged. ADRENALS/URINARY TRACT: Kidneys are orthotopic, demonstrating symmetric enhancement. No nephrolithiasis, hydronephrosis or solid renal masses. The unopacified ureters are normal in course and  caliber. Delayed imaging through the kidneys demonstrates symmetric prompt contrast excretion within the proximal urinary collecting system. Urinary bladder is partially distended and unremarkable. Normal adrenal glands. STOMACH/BOWEL: Large amount of retained large bowel stool with transition point in the sigmoid colon associated with wall thickening, potential mass. Limited assessment due to streak artifact from hip arthroplasties. Small bowel feces and small and large bowel air-fluid levels. Probable gastric antral diverticulum. VASCULAR/LYMPHATIC: Aortoiliac vessels are normal in course and caliber. Mild aortic atherosclerosis. No lymphadenopathy by CT size criteria. REPRODUCTIVE: Status post hysterectomy. OTHER: Small volume ascites. No intraperitoneal free air or drainable fluid collection. MUSCULOSKELETAL: Nonacute. Streak artifact from bilateral hip total arthroplasties. Grade 1 L4-5 grade 1 L5-S1 anterolisthesis on degenerative basis. IMPRESSION: 1. Persistent distal large bowel obstruction with transition point in sigmoid colon concerning for neoplasm, less likely stricture. Recommend colonoscopy. Large volume retained large bowel stool. 2. Increasing small volume ascites. 3. Re- demonstration of 14 mm pancreatic cystic mass. Considering patient's age of greater than 35, recommend follow-up CT in 2 years. This recommendation follows ACR consensus guidelines: Management of Incidental Pancreatic Cysts: A White Paper of the ACR Incidental Findings Committee. [place] 1941;[id number]. Aortic Atherosclerosis (ICD10-I70.0). Electronically Signed   By: Elon Alas M.D.   On: 03/07/2017 19:09   Dg Abd Acute W/chest  Result Date: 03/07/2017 CLINICAL DATA:  Vomiting for several hours EXAM: DG ABDOMEN ACUTE W/ 1V CHEST COMPARISON:  None. FINDINGS: Cardiac shadow is within normal limits. The lungs are well aerated bilaterally. Mild aortic calcifications are seen. Scattered large and small bowel gas  is noted. Mild small bowel dilatation is noted consistent with at least a partial small bowel obstruction. These changes are new from the prior exam. Degenerative changes of lumbar spine are seen. Bilateral hip replacements are noted. IMPRESSION: Ages consistent with ileus partial small bowel obstruction. Electronically Signed   By: Inez Catalina M.D.   On: 03/07/2017 17:21   Dg Colon W/cm Ltd  Result Date: 03/08/2017 CLINICAL DATA:  83 year old female with large bowel obstruction. Unable to advance scope beyond the rectosigmoid junction today. EXAM: BE LIMITED WITH CONTRAST CONTRAST:  Isovue 300 was administered rectally. FLUOROSCOPY TIME:  Fluoroscopy Time:  1 minutes 36 seconds Number of Acquired Spot Images: 14 COMPARISON:  CT 03/07/2017 FINDINGS: Scout image demonstrates marked air-filled dilatation dilatation of both large and small bowel. There air-fluid levels on the lateral decubitus view. Note is made of contrast opacification within the bladder. There are bilateral hip replacements. Contrast is seen  filling the lumen of the rectum to the level of the rectosigmoid junction. Contrast material did not flow beyond this area, despite multiple attempts at repositioning the patient. This corresponds with the areas of obstruction identified on recent CT scan. There is no evidence for perforation or leakage of contrast material. The contrast material was subsequently decompressed. The patient tolerated the procedure without difficulty. IMPRESSION: High-grade obstruction at the rectosigmoid junction without passage of contrast proximally. This corresponds with the area of obstruction identified on recent CT scan. Differential includes obstructing mass, and less likely high-grade stricture at this location. Electronically Signed   By: Kristopher Oppenheim M.D.   On: 03/08/2017 17:35     Scheduled Meds:  Continuous Infusions: . [MAR Hold] sodium chloride    . [MAR Hold] cefoTEtan (CEFOTAN) IV    . dextrose 5 %  and 0.45% NaCl 1,000 mL with potassium chloride 60 mEq infusion 100 mL/hr at 03/09/17 1157  . lactated ringers 10 mL/hr at 03/09/17 1311     LOS: 2 days   Time Spent in minutes   30 minutes  Albirtha Grinage D.O. on 03/09/2017 at 2:06 PM  Between 7am to 7pm - Pager - [phone]  After 7pm go to [email or web address] - password [email or web address]  And look for the night coverage person covering for me after hours  [facility] Office  [phone]

## 2017-03-09 NOTE — Anesthesia Preprocedure Evaluation (Signed)
Anesthesia Evaluation  Patient identified by MRN, date of birth, ID band Patient awake    Reviewed: Allergy & Precautions, NPO status , Patient's Chart, lab work & pertinent test results  History of Anesthesia Complications Negative for: history of anesthetic complications  Airway Mallampati: I  TM Distance: >3 FB Neck ROM: Full    Dental  (+) Teeth Intact   Pulmonary neg pulmonary ROS,    breath sounds clear to auscultation       Cardiovascular negative cardio ROS   Rhythm:Regular     Neuro/Psych PSYCHIATRIC DISORDERS Anxiety CVA, No Residual Symptoms    GI/Hepatic negative GI ROS, Neg liver ROS,   Endo/Other  negative endocrine ROS  Renal/GU negative Renal ROS     Musculoskeletal  (+) Arthritis ,   Abdominal   Peds  Hematology  (+) anemia ,   Anesthesia Other Findings   Reproductive/Obstetrics                             Anesthesia Physical Anesthesia Plan  ASA: II  Anesthesia Plan: General   Post-op Pain Management:    Induction: Intravenous  PONV Risk Score and Plan: 3 and Ondansetron and Dexamethasone  Airway Management Planned: Oral ETT  Additional Equipment: None  Intra-op Plan:   Post-operative Plan: Extubation in OR and Possible Post-op intubation/ventilation  Informed Consent: I have reviewed the patients History and Physical, chart, labs and discussed the procedure including the risks, benefits and alternatives for the proposed anesthesia with the patient or authorized representative who has indicated his/her understanding and acceptance.   Dental advisory given  Plan Discussed with: CRNA and Surgeon  Anesthesia Plan Comments:         Anesthesia Quick Evaluation

## 2017-03-09 NOTE — Progress Notes (Signed)
Patient going to OR. Alert and oriented x 4; no acute distress noted; report was given to the nurse in short stay and the ABx bag (Cefotetan) send to short stay with the patient. CHG bath given. Patient's daughter at bedside, aware about this intervention.

## 2017-03-09 NOTE — Progress Notes (Signed)
[facility] Progress Note  Subjective: She has some abdominal discomfort and feels distended. Barium enema shows high-grade obstruction at rectosigmoid. Biopsies from yesterday's sigmoidoscopy not back.  Objective: Vital signs in last 24 hours: Temp:  [98.1 F (36.7 C)-98.2 F (36.8 C)] 98.2 F (36.8 C) (08/28 0512) Pulse Rate:  [81-109] 101 (08/28 0512) Resp:  [14-24] 18 (08/28 0512) BP: (113-167)/(69-92) 134/69 (08/28 0512) SpO2:  [92 %-98 %] 93 % (08/28 0512) Weight:  [56.2 kg (123 lb 15.7 oz)] 56.2 kg (123 lb 15.7 oz) (08/27 1415) Weight change: -0.054 kg (-1.9 oz)   PE:  No distress  Abdomen distended  Lab Results: Results for orders placed or performed during the hospital encounter of 03/07/17 (from the past 24 hour(s))  Glucose, capillary     Status: Abnormal   Collection Time: 03/08/17  7:43 PM  Result Value Ref Range   Glucose-Capillary 123 (H) 65 - 99 mg/dL  CBC     Status: Abnormal   Collection Time: 03/09/17  2:58 AM  Result Value Ref Range   WBC 3.6 (L) 4.0 - 10.5 K/uL   RBC 4.21 3.87 - 5.11 MIL/uL   Hemoglobin 12.4 12.0 - 15.0 g/dL   HCT 38.1 36.0 - 46.0 %   MCV 90.5 78.0 - 100.0 fL   MCH 29.5 26.0 - 34.0 pg   MCHC 32.5 30.0 - 36.0 g/dL   RDW 14.6 11.5 - 15.5 %   Platelets 233 150 - 400 K/uL  Basic metabolic panel     Status: Abnormal   Collection Time: 03/09/17  2:58 AM  Result Value Ref Range   Sodium 145 135 - 145 mmol/L   Potassium 3.0 (L) 3.5 - 5.1 mmol/L   Chloride 112 (H) 101 - 111 mmol/L   CO2 23 22 - 32 mmol/L   Glucose, Bld 118 (H) 65 - 99 mg/dL   BUN 31 (H) 6 - 20 mg/dL   Creatinine, Ser 0.99 0.44 - 1.00 mg/dL   Calcium 8.7 (L) 8.9 - 10.3 mg/dL   GFR calc non Af Amer 48 (L) >60 mL/min   GFR calc Af Amer 56 (L) >60 mL/min   Anion gap 10 5 - 15  Glucose, capillary     Status: Abnormal   Collection Time: 03/09/17  3:33 AM  Result Value Ref Range   Glucose-Capillary 111 (H) 65 - 99 mg/dL  Glucose, capillary     Status:  Abnormal   Collection Time: 03/09/17  8:06 AM  Result Value Ref Range   Glucose-Capillary 111 (H) 65 - 99 mg/dL    Studies/Results: Dg Colon W/cm Ltd  Result Date: 03/08/2017 CLINICAL DATA:  83 year old female with large bowel obstruction. Unable to advance scope beyond the rectosigmoid junction today. EXAM: BE LIMITED WITH CONTRAST CONTRAST:  Isovue 300 was administered rectally. FLUOROSCOPY TIME:  Fluoroscopy Time:  1 minutes 36 seconds Number of Acquired Spot Images: 14 COMPARISON:  CT 03/07/2017 FINDINGS: Scout image demonstrates marked air-filled dilatation dilatation of both large and small bowel. There air-fluid levels on the lateral decubitus view. Note is made of contrast opacification within the bladder. There are bilateral hip replacements. Contrast is seen filling the lumen of the rectum to the level of the rectosigmoid junction. Contrast material did not flow beyond this area, despite multiple attempts at repositioning the patient. This corresponds with the areas of obstruction identified on recent CT scan. There is no evidence for perforation or leakage of contrast material. The contrast material was subsequently decompressed. The patient tolerated  the procedure without difficulty. IMPRESSION: High-grade obstruction at the rectosigmoid junction without passage of contrast proximally. This corresponds with the area of obstruction identified on recent CT scan. Differential includes obstructing mass, and less likely high-grade stricture at this location. Electronically Signed   By: Kristopher Oppenheim M.D.   On: 03/08/2017 17:35      Assessment: High-grade obstruction at the rectosigmoid  Plan:   Surgery has seen. She will need relief of this obstruction. Surgery will decide on resection versus decompressive type surgery.    SAM F Harneet Noblett 03/09/2017, 10:18 AM  Pager: [phone] If no answer or after 5 PM call [phone]

## 2017-03-09 NOTE — Consult Note (Addendum)
[place] Nurse requested for preoperative stoma site marking  Discussed surgical procedure and stoma creation with patient and family.  Explained role of the [place] nurse team.  Provided the patient with educational booklet/DVD and demonstrated appearance of pouching options.  Answered patient and family questions.   Examined patient lying in bed; unable to tolerate repositioning related to pain.  Her abd is very tight and distended and it will be difficult to predict where folds might occur later. Mark placed in the patient's visual field, away from any creases or abdominal contour issues and within the rectus muscle.  Attempted to mark below the patient's belt line.   Marked for colostomy in the LLQ  __3__ cm to the left of the umbilicus and __1__LK below the umbilicus. Pt plans to go to surgery today; [place] team will follow post-op for further teaching sessions. Julien Girt MSN, RN, [place], [place], [place]

## 2017-03-09 NOTE — Progress Notes (Signed)
Could not get beyond stricture at 25 cm from the anal verge.  Will need colostomy, possible colectomy.  Discussed with the patient and her daughter.  Will go ahead with surgery this afternoon.  Patient has only had ice chips and water.  Kathryne Eriksson. Dahlia Bailiff, MD, [place] [phone] [facility] Surgery

## 2017-03-09 NOTE — Progress Notes (Signed)
Pt received from PACU in a bed - oriented to room.

## 2017-03-09 NOTE — Anesthesia Postprocedure Evaluation (Signed)
Anesthesia Post Note  Patient: [name]  Procedure(s) Performed: Procedure(s) (LRB): EXPLORATORY LAPAROTOMY WITH FROZEN SECTION (N/A) LOOP COLOSTOMY     Anesthesia Post Evaluation  Last Vitals:  Vitals:   03/09/17 1615 03/09/17 1630  BP: 134/76 130/78  Pulse: 96 97  Resp: 14 16  Temp:    SpO2: 96% 93%    Last Pain:  Vitals:   03/09/17 1631  TempSrc:   PainSc: 7                  Corbyn Wildey DAVID

## 2017-03-09 NOTE — Anesthesia Procedure Notes (Signed)
Procedure Name: Intubation Date/Time: 03/09/2017 1:53 PM Performed by: Mariea Clonts Pre-anesthesia Checklist: Patient identified, Emergency Drugs available, Suction available and Patient being monitored Patient Re-evaluated:Patient Re-evaluated prior to induction Oxygen Delivery Method: Circle System Utilized Preoxygenation: Pre-oxygenation with 100% oxygen Induction Type: IV induction Ventilation: Mask ventilation without difficulty Laryngoscope Size: Mac and 3 Grade View: Grade II Tube type: Oral Tube size: 7.0 mm Number of attempts: 1 Airway Equipment and Method: Stylet and Oral airway Placement Confirmation: ETT inserted through vocal cords under direct vision,  positive ETCO2 and breath sounds checked- equal and bilateral Tube secured with: Tape Dental Injury: Teeth and Oropharynx as per pre-operative assessment

## 2017-03-09 NOTE — Progress Notes (Signed)
1 Day Post-Op    CC:  Abdominal pain  Subjective: No real change.  Dr. Hulen Skains has discussed findings with patient and her daughter.  Plan surgery today.  Both are agreeable to this now.    Objective: Vital signs in last 24 hours: Temp:  [98.1 F (36.7 C)-98.2 F (36.8 C)] 98.2 F (36.8 C) (08/28 0512) Pulse Rate:  [81-109] 101 (08/28 0512) Resp:  [14-24] 18 (08/28 0512) BP: (113-167)/(69-92) 134/69 (08/28 0512) SpO2:  [92 %-98 %] 93 % (08/28 0512) Weight:  [56.2 kg (123 lb 15.7 oz)] 56.2 kg (123 lb 15.7 oz) (08/27 1415) Last BM Date: 03/09/17 Almost nothing recorded Afebrile, VSS K+ 3.0 WBC 3.6  BE LIMITED WITH CONTRAST: High-grade obstruction at the rectosigmoid junction without passage of contrast proximally. This corresponds with the area of obstruction identified on recent CT scan. Differential includes obstructing mass, and less likely high-grade stricture at this location.   Intake/Output from previous day: 08/27 0701 - 08/28 0700 In: 718.8 [I.V.:718.8] Out: 1 [Stool:1] Intake/Output this shift: No intake/output data recorded.  General appearance: alert, cooperative and no distress Resp: clear to auscultation bilaterally GI: distended and sore, no real change.   Lab Results:   Recent Labs  03/08/17 0524 03/09/17 0258  WBC 5.2 3.6*  HGB 11.7* 12.4  HCT 35.5* 38.1  PLT 228 233    BMET  Recent Labs  03/08/17 0524 03/09/17 0258  NA 143 145  K 3.0* 3.0*  CL 111 112*  CO2 20* 23  GLUCOSE 137* 118*  BUN 38* 31*  CREATININE 1.17* 0.99  CALCIUM 8.6* 8.7*   PT/INR No results for input(s): LABPROT, INR in the last 72 hours.   Recent Labs Lab 03/04/17 1822  AST 24  ALT 11*  ALKPHOS 93  BILITOT 1.3*  PROT 7.3  ALBUMIN 4.1     Lipase     Component Value Date/Time   LIPASE 30 03/04/2017 1822     Medications:  Flex sig report:  Not able to advance scope beyond rectosigmoid area at 20-25 centimeter because of significant  narrowing    EGD  scope was used without any success. Abnormal appearing mucosa/ granularity noted at rectosigmoid junction. Biopsy was taken  Assessment/Plan Abdominal pain, constipation, nausea and vomiting High grade rectosigmoid obstruction Flexible sigmoidoscopy 03/08/17: obstruction at 20-25 cm Chronic renal disease Chronic consipation Movement disorder Hx of CVA Bilateral hip replacements FEN:  IV fluids ID:  None DVT:  SCD  Plan:  Discussed with Dr. Hulen Skains, patient and family.  She agrees on surgery today.       LOS: 2 days    [name] 03/09/2017 [phone]

## 2017-03-09 NOTE — Consult Note (Signed)
    [facility] CM Primary Care Navigator  03/09/2017  [name] 27-Feb-1924 [id number]   Went to see patient at the bedside to identify possible discharge needs but RN reports that patient is off the unit for surgery (Colectomy with Colostomy Creation).  Will attempt to meet with patient at another time when available in the room.      AddendumMartin Majestic back to see patient in the room to identify discharge needs but staff reports that patient was transferred to another unit- 32M 06 after surgery.  Will attempt to see patient at another time when out of ICU.   For questions, please contact:  Dannielle Huh, BSN, RN- [facility] Primary Care Navigator  Telephone: [phone] [place]

## 2017-03-09 NOTE — Op Note (Signed)
OPERATIVE REPORT  DATE OF OPERATION:  03/09/2017  PATIENT:  [name]  83 y.o. female  PRE-OPERATIVE DIAGNOSIS:  SIGMOID COLON OBSTRUCTION  POST-OPERATIVE DIAGNOSIS:  SIGMOID COLON OBSTRUCTION FROM ADENOCARCINOMA WITH CARCINOMATOSIS  INDICATION(S) FOR OPERATION:  Large bowel obstruction at the rectosigmoid colon  FINDINGS:  Carcinomatosis of adenocarcinoma of bowel serosa, omentum, and diaphragm implants   PROCEDURE:  Procedure(s): EXPLORATORY LAPAROTOMY WITH FROZEN SECTION LOOP COLOSTOMY BIOPSY OF PERITONEAL IMPLANTS  SURGEON:  Surgeon(s): Judeth Horn, MD Georganna Skeans, MD  ASSISTANT: Grandville Silos and Creig Hines  ANESTHESIA:   general  COMPLICATIONS:  None  EBL: <30 ml  BLOOD ADMINISTERED: none  DRAINS: Urinary Catheter (Foley) and loop colostomy of the LUQ   SPECIMEN:  Source of Specimen:  peritoneal implants from the left paracolic area  COUNTS CORRECT:  YES  PROCEDURE DETAILS: The patient was taken to the operating room and placed on the table in the supine position. After an adequate general endotracheal anesthetic was administered, she was prepped and draped in the usual sterile manner.  A proper timeout was performed identifying the patient and procedure to be performed. A lower midline incision was made from the umbilicus down to the pubis. We took it down to and through the midline fascia safely. We entered the peritoneal cavity where a large redundant and distended transverse colon was encountered. The omentum was contracted and had implants of carcinomatosis. Also implants along the left paracolic area and also along the diaphragmatic service on expiration. There were implants on the hepatic surface but no actual metastatic lesions that appeared to be in the parenchyma of the liver.  Biopsies were taken for frozen section of the peritoneal implants along the left paracolic area which confirm adenocarcinoma. The patient had a very stuck and frozen pelvic area  near the distal descending colon and sigmoid colon and no attempt to resect this was made. Instead we brought out the proximal descending colon as a loop colostomy in the left upper quadrant at the spot predetermine before surgery. A bar was placed underneath the mesenteric defect that was maintained on the surface. We closed the fascia using running looped #1 PDS suture and attempted to decompress the colon however the thickness of the stool from the colon did not allow for easy decompression. We therefore closed the fascia and the skin then matured the loop colostomy in the left upper quadrant with bar underneath the mesenteric defect. A colostomy bag was placed in a sterile dressing applied. All needle counts, sponge counts, and instrument counts were correct.  PATIENT DISPOSITION:  PACU - hemodynamically stable.   Mayford Alberg 8/28/20183:21 PM

## 2017-03-10 ENCOUNTER — Encounter (HOSPITAL_COMMUNITY): Payer: Self-pay | Admitting: General Surgery

## 2017-03-10 ENCOUNTER — Inpatient Hospital Stay (HOSPITAL_COMMUNITY): Payer: Medicare Other

## 2017-03-10 LAB — GLUCOSE, CAPILLARY
GLUCOSE-CAPILLARY: 161 mg/dL — AB (ref 65–99)
Glucose-Capillary: 170 mg/dL — ABNORMAL HIGH (ref 65–99)
Glucose-Capillary: 172 mg/dL — ABNORMAL HIGH (ref 65–99)

## 2017-03-10 LAB — CBC
HEMATOCRIT: 40.2 % (ref 36.0–46.0)
HEMOGLOBIN: 12.6 g/dL (ref 12.0–15.0)
MCH: 28.6 pg (ref 26.0–34.0)
MCHC: 31.3 g/dL (ref 30.0–36.0)
MCV: 91.2 fL (ref 78.0–100.0)
Platelets: 200 10*3/uL (ref 150–400)
RBC: 4.41 MIL/uL (ref 3.87–5.11)
RDW: 14.9 % (ref 11.5–15.5)
WBC: 4.1 10*3/uL (ref 4.0–10.5)

## 2017-03-10 LAB — BASIC METABOLIC PANEL
ANION GAP: 6 (ref 5–15)
BUN: 29 mg/dL — AB (ref 6–20)
CALCIUM: 8.3 mg/dL — AB (ref 8.9–10.3)
CHLORIDE: 112 mmol/L — AB (ref 101–111)
CO2: 25 mmol/L (ref 22–32)
CREATININE: 1.04 mg/dL — AB (ref 0.44–1.00)
GFR, EST AFRICAN AMERICAN: 53 mL/min — AB (ref >60)
GFR, EST NON AFRICAN AMERICAN: 46 mL/min — AB (ref >60)
Glucose, Bld: 193 mg/dL — ABNORMAL HIGH (ref 65–99)
POTASSIUM: 4.2 mmol/L (ref 3.5–5.1)
SODIUM: 143 mmol/L (ref 135–145)

## 2017-03-10 LAB — MAGNESIUM: MAGNESIUM: 2.1 mg/dL (ref 1.7–2.4)

## 2017-03-10 MED ORDER — ENOXAPARIN SODIUM 40 MG/0.4ML ~~LOC~~ SOLN
40.0000 mg | SUBCUTANEOUS | Status: DC
  Administered 2017-03-10: 40 mg via SUBCUTANEOUS
  Filled 2017-03-10: qty 0.4

## 2017-03-10 MED ORDER — ACETAMINOPHEN 10 MG/ML IV SOLN
1000.0000 mg | Freq: Four times a day (QID) | INTRAVENOUS | Status: AC
  Administered 2017-03-11 (×3): 1000 mg via INTRAVENOUS
  Filled 2017-03-10 (×3): qty 100

## 2017-03-10 MED ORDER — ORAL CARE MOUTH RINSE
15.0000 mL | Freq: Two times a day (BID) | OROMUCOSAL | Status: DC
  Administered 2017-03-10 – 2017-03-14 (×8): 15 mL via OROMUCOSAL

## 2017-03-10 MED ORDER — HYDROMORPHONE HCL 1 MG/ML IJ SOLN
0.5000 mg | INTRAMUSCULAR | Status: DC | PRN
  Filled 2017-03-10: qty 0.5

## 2017-03-10 MED ORDER — SODIUM CHLORIDE 0.9 % IV BOLUS (SEPSIS): 500.0000 mL | Freq: Once | INTRAVENOUS | Status: DC

## 2017-03-10 MED ORDER — ACETAMINOPHEN 10 MG/ML IV SOLN
1000.0000 mg | Freq: Four times a day (QID) | INTRAVENOUS | Status: DC
  Administered 2017-03-10: 1000 mg via INTRAVENOUS
  Filled 2017-03-10 (×4): qty 100

## 2017-03-10 NOTE — Consult Note (Signed)
[place] Nurse ostomy consult note Surgical team following for assessment and plan of care for abd wound. Stoma type/location: Colostomy surgery was performed yesterday; current pouch in place with good seal Stomal assessment/size: Unable to visualize through pouch related to large amt brown semiformed stool Ostomy pouching: 2pc.  Education provided:  Will perform pouch change demonstration tomorrow since this is the first post-op day.  Supplies ordered to the room for staff nurse use. Enrolled patient in [place] program: No Julien Girt MSN, [place], [place], [place], [place]

## 2017-03-10 NOTE — Clinical Social Work Note (Signed)
Clinical Social Work Assessment  Patient Details  Name: [name] MRN: [id number] Date of Birth: Apr 11, 1924  Date of referral:  03/10/17               Reason for consult:  Facility Placement                Permission sought to share information with:  Chartered certified accountant granted to share information::  Yes, Verbal Permission Granted  Name::     Airline pilot::  SNF  Relationship::  dtr  Contact Information:     Housing/Transportation Living arrangements for the past 2 months:  [place] of Information:  Patient, Adult Children Patient Interpreter Needed:  None Criminal Activity/Legal Involvement Pertinent to Current Situation/Hospitalization:  No - Comment as needed Significant Relationships:  Adult Children Lives with:  Self Do you feel safe going back to the place where you live?  No Need for family participation in patient care:  No (Coment)  Care giving concerns:  Pt lives in IDL alone now with new colostomy and weakness and wanting increased care for time of DC.   Social Worker assessment / plan:  CSW spoke with pt and dtr concerning interest in SNF.  They confirm pt is from [facility].  CSW explained CSW role in transition to [place] rehab portion.  Employment status:  Retired Forensic scientist:  Medicare PT Recommendations:  Not assessed at this time Information / Referral to community resources:  [place]  Patient/Family's Response to care:  Agreeable to SNF and hopeful they will have bed at her facility when she is ready for DC.  Patient/Family's Understanding of and Emotional Response to Diagnosis, Current Treatment, and Prognosis:  No questions or concerns.  Hopeful for quick recovery.  Emotional Assessment Appearance:  Appears younger than stated age Attitude/Demeanor/Rapport:    Affect (typically observed):  Appropriate, Accepting Orientation:  Oriented to Self, Oriented to Place,  Oriented to  Time, Oriented to Situation Alcohol / Substance use:  Not Applicable Psych involvement (Current and /or in the community):  No (Comment)  Discharge Needs  Concerns to be addressed:  Care Coordination Readmission within the last 30 days:  No Current discharge risk:  Physical Impairment Barriers to Discharge:  Continued Medical Work up   Jorge Ny, LCSW 03/10/2017, 12:19 PM

## 2017-03-10 NOTE — NC FL2 (Signed)
[place] LEVEL OF CARE SCREENING TOOL     IDENTIFICATION  Patient Name: [name] Birthdate: 28-Jul-1923 Sex: female Admission Date (Current Location): 03/07/2017  [facility] and [place] Number:  Herbalist and Address:  [facility], [place] [address], [place], [place] [address]      Provider Number: [id number]  Attending Physician Name and Address:  Hosie Poisson, MD  Relative Name and Phone Number:       Current Level of Care: Hospital Recommended Level of Care: [place] Prior Approval Number:    Date Approved/Denied:   PASRR Number: [id number]  Discharge Plan: SNF    Current Diagnoses: Patient Active Problem List   Diagnosis Date Noted  . Large bowel obstruction ([place]) 03/07/2017  . History of CVA in adulthood 03/07/2017  . Bowel obstruction ([place]) 03/07/2017  . Acute CVA (cerebrovascular accident) ([place]) 11/01/2014  . Elevated homocysteine ([place]) 11/01/2014  . Acute blood loss anemia 03/09/2012  . S/P left THA, AA 03/08/2012    Orientation RESPIRATION BLADDER Height & Weight     Self, Time, Situation, Place  Normal Incontinent, Indwelling catheter Weight: 130 lb 15.3 oz (59.4 kg) Height:  5\' 3"  (160 cm)  BEHAVIORAL SYMPTOMS/MOOD NEUROLOGICAL BOWEL NUTRITION STATUS      Incontinent, Colostomy Diet (see DC summary)  AMBULATORY STATUS COMMUNICATION OF NEEDS Skin   Limited Assist Verbally Surgical wounds (colostomy site requiring honeycomb dressing)                       Personal Care Assistance Level of Assistance  Bathing, Dressing Bathing Assistance: Limited assistance   Dressing Assistance: Limited assistance     Functional Limitations Info             SPECIAL CARE FACTORS FREQUENCY  PT (By licensed PT), OT (By licensed OT)     PT Frequency: 5/wk OT Frequency: 5/wk            Contractures      Additional Factors Info  Code Status, Allergies Code Status Info:  DNR Allergies Info: NKA           Current Medications (03/10/2017):  This is the current hospital active medication list Current Facility-Administered Medications  Medication Dose Route Frequency Provider Last Rate Last Dose  . 0.9 %  sodium chloride infusion   Intravenous Once Judeth Horn, MD      . acetaminophen (OFIRMEV) IV 1,000 mg  1,000 mg Intravenous Q6H Earnstine Regal, PA-C      . acetaminophen (TYLENOL) tablet 650 mg  650 mg Oral Q6H PRN Rise Patience, MD       Or  . acetaminophen (TYLENOL) suppository 650 mg  650 mg Rectal Q6H PRN Rise Patience, MD      . dextrose 5 % and 0.45% NaCl 1,000 mL with potassium chloride 60 mEq infusion   Intravenous Continuous Earnstine Regal, PA-C 100 mL/hr at 03/10/17 0756    . enoxaparin (LOVENOX) injection 40 mg  40 mg Subcutaneous Q24H Earnstine Regal, PA-C      . famotidine (PEPCID) IVPB 20 mg premix  20 mg Intravenous Q12H Earnstine Regal, PA-C 100 mL/hr at 03/10/17 0921 20 mg at 03/10/17 0921  . lactated ringers infusion   Intravenous Continuous Oleta Mouse, MD 10 mL/hr at 03/09/17 1311    . MEDLINE mouth rinse  15 mL Mouth Rinse BID Cristal Ford, DO   15 mL at 03/10/17 6948  . morphine 4  MG/ML injection 1-2 mg  1-2 mg Intravenous Q1H PRN Earnstine Regal, PA-C   2 mg at 03/09/17 2253  . ondansetron (ZOFRAN) tablet 4 mg  4 mg Oral Q6H PRN Rise Patience, MD       Or  . ondansetron [facility]) injection 4 mg  4 mg Intravenous Q6H PRN Rise Patience, MD      . sodium chloride 0.9 % bolus 500 mL  500 mL Intravenous Once Earnstine Regal, PA-C         Discharge Medications: Please see discharge summary for a list of discharge medications.  Relevant Imaging Results:  Relevant Lab Results:   Additional Information SS#: [id number]  Jorge Ny, LCSW

## 2017-03-10 NOTE — Progress Notes (Signed)
1 Day Post-Op    OV:FIEPPIRJJ pain,constipation, nausea and vomiting   Subjective: She looks good this AM.  Stool in ostomy, some decrease in the abdominal distension.  Wound OK  Objective: Vital signs in last 24 hours: Temp:  [97.5 F (36.4 C)-98.1 F (36.7 C)] 98.1 F (36.7 C) (08/29 0750) Pulse Rate:  [93-103] 93 (08/29 0750) Resp:  [12-19] 13 (08/29 0750) BP: (111-149)/(65-85) 111/65 (08/29 0750) SpO2:  [90 %-99 %] 90 % (08/29 0750) Weight:  [59.4 kg (130 lb 15.3 oz)] 59.4 kg (130 lb 15.3 oz) (08/28 1851) Last BM Date: 03/10/17 4278 IV 325 urine 490 stool Afebrile, VSS BMP pending  CBC is stable  Intake/Output from previous day: 08/28 0701 - 08/29 0700 In: 4278.2 [I.V.:3928.2; IV Piggyback:350] Out: 840 [Urine:325; Stool:490; Blood:25] Intake/Output this shift: No intake/output data recorded.  General appearance: alert and awake, using sponge to wet mouth. Appears pretty comfortable. Resp: clear GI: + BS, wound OK, less distension stool in the ostomy Lab Results:   Recent Labs  03/09/17 0258 03/10/17 0606  WBC 3.6* 4.1  HGB 12.4 12.6  HCT 38.1 40.2  PLT 233 200    BMET  Recent Labs  03/08/17 0524 03/09/17 0258  NA 143 145  K 3.0* 3.0*  CL 111 112*  CO2 20* 23  GLUCOSE 137* 118*  BUN 38* 31*  CREATININE 1.17* 0.99  CALCIUM 8.6* 8.7*   PT/INR No results for input(s): LABPROT, INR in the last 72 hours.   Recent Labs Lab 03/04/17 1822  AST 24  ALT 11*  ALKPHOS 93  BILITOT 1.3*  PROT 7.3  ALBUMIN 4.1     Lipase     Component Value Date/Time   LIPASE 30 03/04/2017 1822     Medications: . mouth rinse  15 mL Mouth Rinse BID    Assessment/Plan Abdominal pain, constipation, nausea and vomiting High grade rectosigmoid obstruction Obstructive colon mass,with metastasis to the liver and peritoneium S/p exploratory laparotomy with diverting colostomy, 03/09/17,Dr. Judeth Horn, Colon bx negative, frozen section preliminary -  Adenocarcinoma Flexible sigmoidoscopy 03/08/17: obstruction at 20-25 cm Chronic renal disease Chronic consipation Movement disorder Hx of CVA Bilateral hip replacements FEN: IV fluids/NPO ID: Pre op Cefotetan DVT: SCD/Lovenox    Plan: OOB, dc foley tomorrow, start her on sips and ice chips   LOS: 3 days    [name] 03/10/2017 [phone]

## 2017-03-10 NOTE — Progress Notes (Signed)
,  PROGRESS NOTE    [name]  WIO:[id number] DOB: 1924/02/21 DOA: 03/07/2017 PCP: Antony Contras, MD   Brief Narrative: [name] a 83 y.o.femalewith history of TIA presents to the ER because of persistent abdominal pain with nausea vomiting. She was found to have high grade rectosigmoid obstruction with a colon mass with mets to liver and peritoneum. She underwent exp lap with diverting colostomy on 8/28. Biopsy showed adeno carcinoma. Currently she is doing better, but still under considerable pain.   Assessment & Plan:   Principal Problem:   Large bowel obstruction (HCC) Active Problems:   History of CVA in adulthood   Bowel obstruction (HCC)   Adenocarcinoma of the colon with mets to liver and peritoneum: causing rectosigmoid obstruction leading to symptoms.  Underwent exp lap with diverting colostomy.  Gentle hydration, still NPO. Pain control and symptom management.  Surgery on board and appreciate their recommendations.    AKI: with stage 3 CKD:  Improving.    Hypokalemia: replete as needed.   H/o TIA:  On aspirin at home, which is on hold.     DVT prophylaxis: scd's Code Status: DNR Family Communication: NONE at bedside.  Disposition Plan: transfer to floor today. .   Consultants:   Surgery.    Procedures: exp lap   Antimicrobials: pre op antibiotics.    Subjective: Reports dull abd pain.   Objective: Vitals:   03/10/17 0413 03/10/17 0750 03/10/17 1137 03/10/17 1610  BP:  111/65 107/65 117/70  Pulse:  93  95  Resp:  13 17 16   Temp: (!) 97.5 F (36.4 C) 98.1 F (36.7 C) 97.7 F (36.5 C) 98.4 F (36.9 C)  TempSrc: Oral Oral Oral Oral  SpO2:  90% 93% 90%  Weight:      Height:        Intake/Output Summary (Last 24 hours) at 03/10/17 1728 Last data filed at 03/10/17 1000  Gross per 24 hour  Intake          2878.18 ml  Output             1065 ml  Net          1813.18 ml   Filed Weights   03/07/17 2110 03/08/17 1415 03/09/17  1851  Weight: 56.3 kg (124 lb 1.6 oz) 56.2 kg (123 lb 15.7 oz) 59.4 kg (130 lb 15.3 oz)    Examination:  General exam: Appears calm and comfortable  Respiratory system: Clear to auscultation. Respiratory effort normal. Cardiovascular system: S1 & S2 heard, RRR. No JVD, murmurs, rubs, gallops or clicks. No pedal edema. Gastrointestinal system: Abdomen soft bandaged. Tender.  Central nervous system: Alert and oriented. No focal neurological deficits. Extremities: Symmetric 5 x 5 power. Skin: No rashes, lesions or ulcers Psychiatry: Judgement and insight appear normal. Mood & affect appropriate.     Data Reviewed: I have personally reviewed following labs and imaging studies  CBC:  Recent Labs Lab 03/04/17 1822 03/07/17 1733 03/08/17 0524 03/09/17 0258 03/10/17 0606  WBC 6.5 5.8 5.2 3.6* 4.1  NEUTROABS  --  5.0  --   --   --   HGB 13.5 12.9 11.7* 12.4 12.6  HCT 40.4 39.2 35.5* 38.1 40.2  MCV 88.8 89.3 89.4 90.5 91.2  PLT 273 283 228 233 426   Basic Metabolic Panel:  Recent Labs Lab 03/04/17 1822 03/07/17 1733 03/08/17 0524 03/09/17 0258 03/10/17 0606  NA 140 139 143 145 143  K 3.1* 3.7 3.0* 3.0* 4.2  CL  106 104 111 112* 112*  CO2 23 22 20* 23 25  GLUCOSE 125* 137* 137* 118* 193*  BUN 27* 46* 38* 31* 29*  CREATININE 0.93 1.65* 1.17* 0.99 1.04*  CALCIUM 9.6 9.6 8.6* 8.7* 8.3*  MG  --   --  2.4  --  2.1   GFR: Estimated Creatinine Clearance: 29.1 mL/min (A) (by C-G formula based on SCr of 1.04 mg/dL (H)). Liver Function Tests:  Recent Labs Lab 03/04/17 1822  AST 24  ALT 11*  ALKPHOS 93  BILITOT 1.3*  PROT 7.3  ALBUMIN 4.1    Recent Labs Lab 03/04/17 1822  LIPASE 30   No results for input(s): AMMONIA in the last 168 hours. Coagulation Profile: No results for input(s): INR, PROTIME in the last 168 hours. Cardiac Enzymes: No results for input(s): CKTOTAL, CKMB, CKMBINDEX, TROPONINI in the last 168 hours. BNP (last 3 results) No results for  input(s): PROBNP in the last 8760 hours. HbA1C: No results for input(s): HGBA1C in the last 72 hours. CBG:  Recent Labs Lab 03/08/17 1943 03/09/17 0333 03/09/17 0806 03/10/17 0059 03/10/17 0928  GLUCAP 123* 111* 111* 172* 170*   Lipid Profile: No results for input(s): CHOL, HDL, LDLCALC, TRIG, CHOLHDL, LDLDIRECT in the last 72 hours. Thyroid Function Tests: No results for input(s): TSH, T4TOTAL, FREET4, T3FREE, THYROIDAB in the last 72 hours. Anemia Panel: No results for input(s): VITAMINB12, FOLATE, FERRITIN, TIBC, IRON, RETICCTPCT in the last 72 hours. Sepsis Labs: No results for input(s): PROCALCITON, LATICACIDVEN in the last 168 hours.  Recent Results (from the past 240 hour(s))  MRSA PCR Screening     Status: None   Collection Time: 03/09/17 11:52 AM  Result Value Ref Range Status   MRSA by PCR NEGATIVE NEGATIVE Final    Comment:        The GeneXpert MRSA Assay (FDA approved for NASAL specimens only), is one component of a comprehensive MRSA colonization surveillance program. It is not intended to diagnose MRSA infection nor to guide or monitor treatment for MRSA infections.          Radiology Studies: No results found.      Scheduled Meds: . enoxaparin (LOVENOX) injection  40 mg Subcutaneous Q24H  . mouth rinse  15 mL Mouth Rinse BID   Continuous Infusions: . sodium chloride    . acetaminophen Stopped (03/10/17 1236)  . dextrose 5 % and 0.45% NaCl 1,000 mL with potassium chloride 60 mEq infusion 100 mL/hr at 03/10/17 0756  . famotidine (PEPCID) IV Stopped (03/10/17 0951)  . lactated ringers 10 mL/hr at 03/09/17 1311  . sodium chloride       LOS: 3 days    Time spent: 35 minutes.     Hosie Poisson, MD [facility] Pager [phone]  If 7PM-7AM, please contact night-coverage [email or web address] Password [facility] 03/10/2017, 5:28 PM

## 2017-03-11 LAB — GLUCOSE, CAPILLARY
GLUCOSE-CAPILLARY: 126 mg/dL — AB (ref 65–99)
GLUCOSE-CAPILLARY: 137 mg/dL — AB (ref 65–99)
Glucose-Capillary: 141 mg/dL — ABNORMAL HIGH (ref 65–99)
Glucose-Capillary: 125 mg/dL — ABNORMAL HIGH (ref 65–99)

## 2017-03-11 LAB — CBC WITH DIFFERENTIAL/PLATELET
BASOS PCT: 0 %
Basophils Absolute: 0 10*3/uL (ref 0.0–0.1)
EOS ABS: 0 10*3/uL (ref 0.0–0.7)
Eosinophils Relative: 0 %
HCT: 43.1 % (ref 36.0–46.0)
Hemoglobin: 13.6 g/dL (ref 12.0–15.0)
LYMPHS ABS: 0.8 10*3/uL (ref 0.7–4.0)
Lymphocytes Relative: 13 %
MCH: 28.8 pg (ref 26.0–34.0)
MCHC: 31.6 g/dL (ref 30.0–36.0)
MCV: 91.3 fL (ref 78.0–100.0)
Monocytes Absolute: 0.4 10*3/uL (ref 0.1–1.0)
Monocytes Relative: 6 %
NEUTROS PCT: 81 %
Neutro Abs: 5.2 10*3/uL (ref 1.7–7.7)
PLATELETS: 199 10*3/uL (ref 150–400)
RBC: 4.72 MIL/uL (ref 3.87–5.11)
RDW: 15.1 % (ref 11.5–15.5)
WBC: 6.4 10*3/uL (ref 4.0–10.5)

## 2017-03-11 LAB — BASIC METABOLIC PANEL
Anion gap: 7 (ref 5–15)
BUN: 32 mg/dL — AB (ref 6–20)
CO2: 18 mmol/L — ABNORMAL LOW (ref 22–32)
Calcium: 8.3 mg/dL — ABNORMAL LOW (ref 8.9–10.3)
Chloride: 117 mmol/L — ABNORMAL HIGH (ref 101–111)
Creatinine, Ser: 1.39 mg/dL — ABNORMAL HIGH (ref 0.44–1.00)
GFR calc Af Amer: 37 mL/min — ABNORMAL LOW (ref >60)
GFR calc non Af Amer: 32 mL/min — ABNORMAL LOW (ref >60)
Glucose, Bld: 115 mg/dL — ABNORMAL HIGH (ref 65–99)
POTASSIUM: 5.1 mmol/L (ref 3.5–5.1)
SODIUM: 142 mmol/L (ref 135–145)

## 2017-03-11 MED ORDER — ENOXAPARIN SODIUM 30 MG/0.3ML ~~LOC~~ SOLN
30.0000 mg | SUBCUTANEOUS | Status: DC
  Administered 2017-03-11 – 2017-03-14 (×4): 30 mg via SUBCUTANEOUS
  Filled 2017-03-11 (×4): qty 0.3

## 2017-03-11 MED ORDER — DEXTROSE-NACL 5-0.45 % IV SOLN
INTRAVENOUS | Status: DC
  Administered 2017-03-11 – 2017-03-12 (×3): via INTRAVENOUS

## 2017-03-11 MED ORDER — FAMOTIDINE IN NACL 20-0.9 MG/50ML-% IV SOLN
20.0000 mg | INTRAVENOUS | Status: DC
  Administered 2017-03-12 – 2017-03-13 (×2): 20 mg via INTRAVENOUS
  Filled 2017-03-11 (×2): qty 50

## 2017-03-11 MED ORDER — SODIUM CHLORIDE 0.9 % IV SOLN
500.0000 mL | Freq: Once | INTRAVENOUS | Status: AC
  Administered 2017-03-11: 500 mL via INTRAVENOUS

## 2017-03-11 NOTE — Evaluation (Signed)
Physical Therapy Evaluation Patient Details Name: [name] MRN: [id number] DOB: February 25, 1924 Today's Date: 03/11/2017   History of Present Illness  83 y.o. female with history of TIA, arthritis and movement disorder admitted with high grade rectosigmoid obstruction with a colon mass with mets to liver and peritoneum. She underwent exp lap with diverting colostomy on 8/28. Biopsy showed adeno carcinoma  Clinical Impression  Pt pleasant willing to mobilize and able to tolerate short distance of ambulation in room. Pt with decreased mobility, gait, function and strength who will benefit from acute therapy to maximize mobility, function and independence. Pt is from [place] and plans for SNF at D/C until able to return to independent function. Pt encourage to mobilize with nursing daily.     Follow Up Recommendations SNF;Supervision for mobility/OOB    Equipment Recommendations  None recommended by PT    Recommendations for Other Services       Precautions / Restrictions Precautions Precautions: Fall      Mobility  Bed Mobility Overal bed mobility: Needs Assistance Bed Mobility: Rolling;Sidelying to Sit Rolling: Min assist Sidelying to sit: Min assist       General bed mobility comments: cues for sequence with assist to fully rotate pelvis and elevate trunk  Transfers Overall transfer level: Needs assistance   Transfers: Sit to/from Stand Sit to Stand: Min guard         General transfer comment: cues for hand placement and safety  Ambulation/Gait Ambulation/Gait assistance: Min guard Ambulation Distance (Feet): 18 Feet Assistive device: Rolling walker (2 wheeled)     Gait velocity interpretation: Below normal speed for age/gender General Gait Details: cues for posture, position in RW and safety. limited by fatigue  Stairs            Wheelchair Mobility    Modified Rankin (Stroke Patients Only)       Balance Overall balance assessment: Needs  assistance   Sitting balance-Leahy Scale: Good       Standing balance-Leahy Scale: Fair                               Pertinent Vitals/Pain Pain Assessment: 0-10 Pain Score: 2  Pain Location: abdomen Pain Descriptors / Indicators: Sore Pain Intervention(s): Limited activity within patient's tolerance;Repositioned    Home Living Family/patient expects to be discharged to:: Skilled nursing facility Living Arrangements: Alone   Type of Home: Independent living facility Home Access: Level entry     Home Layout: One level Home Equipment: Environmental consultant - 4 wheels      Prior Function Level of Independence: Independent with assistive device(s)         Comments: pt uses rollator for gait, staff does the cleaning, she walks to dining hall for meals     Hand Dominance        Extremity/Trunk Assessment   Upper Extremity Assessment Upper Extremity Assessment: Generalized weakness    Lower Extremity Assessment Lower Extremity Assessment: Generalized weakness    Cervical / Trunk Assessment Cervical / Trunk Assessment: Kyphotic  Communication   Communication: No difficulties  Cognition Arousal/Alertness: Awake/alert Behavior During Therapy: WFL for tasks assessed/performed Overall Cognitive Status: Within Functional Limits for tasks assessed                                        General Comments  Exercises     Assessment/Plan    PT Assessment Patient needs continued PT services  PT Problem List Decreased mobility;Decreased activity tolerance;Decreased balance;Pain       PT Treatment Interventions Gait training;Therapeutic exercise;Patient/family education;DME instruction;Therapeutic activities;Functional mobility training;Balance training    PT Goals (Current goals can be found in the Care Plan section)  Acute Rehab PT Goals Patient Stated Goal: return to my apartment PT Goal Formulation: With patient Time For Goal  Achievement: 03/25/17 Potential to Achieve Goals: Good    Frequency Min 3X/week   Barriers to discharge Decreased caregiver support      Co-evaluation               AM-PAC PT "6 Clicks" Daily Activity  Outcome Measure Difficulty turning over in bed (including adjusting bedclothes, sheets and blankets)?: A Lot Difficulty moving from lying on back to sitting on the side of the bed? : Unable Difficulty sitting down on and standing up from a chair with arms (e.g., wheelchair, bedside commode, etc,.)?: A Little Help needed moving to and from a bed to chair (including a wheelchair)?: A Little Help needed walking in hospital room?: A Little Help needed climbing 3-5 steps with a railing? : A Lot 6 Click Score: 14    End of Session Equipment Utilized During Treatment: Gait belt Activity Tolerance: Patient tolerated treatment well Patient left: in chair;with call bell/phone within reach;with chair alarm set Nurse Communication: Mobility status PT Visit Diagnosis: Other abnormalities of gait and mobility (R26.89);Difficulty in walking, not elsewhere classified (R26.2)    Time: 1610-9604 PT Time Calculation (min) (ACUTE ONLY): 25 min   Charges:   PT Evaluation $PT Eval Moderate Complexity: 1 Mod PT Treatments $Therapeutic Activity: 8-22 mins   PT G Codes:        Elwyn Reach, PT [phone]   [place] 03/11/2017, 11:24 AM

## 2017-03-11 NOTE — Progress Notes (Addendum)
,  PROGRESS NOTE    [name]  BJY:[id number] DOB: 1923/10/02 DOA: 03/07/2017 PCP: Antony Contras, MD   Brief Narrative: [name] a 83 y.o.femalewith history of TIA presents to the ER because of persistent abdominal pain with nausea vomiting. She was found to have high grade rectosigmoid obstruction with a colon mass with mets to liver and peritoneum. She underwent exp lap with diverting colostomy on 8/28.  Currently she is doing better, surgery recommended advancing his diet.   Assessment & Plan:   Principal Problem:   Large bowel obstruction (HCC) Active Problems:   History of CVA in adulthood   Bowel obstruction (HCC)  Colon Mass: causing rectosigmoid obstruction leading to symptoms.  Underwent exp lap with diverting colostomy.  Gentle hydration, still NPO. Pain control and symptom management.  Surgery on board and appreciate their recommendations.  Recommended to advance diet today, wills tart her on sips of clears. Discussed with the nurse.    AKI: with stage 3 CKD: suspect from dehydration and no po intake.  On gentle hydration with dextrose and 1/2 ns fluids.  Improving.    Hypokalemia: replete as needed.   H/o TIA:  On aspirin at home, which is on hold for now.  Resume it once she is able to tolerate by mouth.     DVT prophylaxis: scd's Code Status: DNR Family Communication: NONE at bedside.  Disposition Plan: transfer to floor today when bed available.  Pending PT eval. posisbly to SNF.    Consultants:   Surgery.    Procedures: exp lap   Antimicrobials: pre op antibiotics.    Subjective: ABD PAIN IMPROVED WITH TYLENOL TODAY.  Currently pain free.   Objective: Vitals:   03/11/17 0400 03/11/17 0808 03/11/17 1148 03/11/17 1603  BP: (!) 91/58 99/62 100/65 101/61  Pulse: (!) 110  98 (!) 101  Resp: 18 (!) 22 19 18   Temp:  98.1 F (36.7 C) (!) 97.5 F (36.4 C) 98.2 F (36.8 C)  TempSrc:  Oral Oral Oral  SpO2: 91%     Weight:        Height:        Intake/Output Summary (Last 24 hours) at 03/11/17 1646 Last data filed at 03/11/17 1606  Gross per 24 hour  Intake          2842.84 ml  Output             1300 ml  Net          1542.84 ml   Filed Weights   03/07/17 2110 03/08/17 1415 03/09/17 1851  Weight: 56.3 kg (124 lb 1.6 oz) 56.2 kg (123 lb 15.7 oz) 59.4 kg (130 lb 15.3 oz)    Examination:  General exam: Appears calm and comfortable not in any distress.  Respiratory system: Clear to auscultation. Respiratory effort normal. Cardiovascular system: S1 & S2 heard, RRR. No JVD, murmurs, rubs, gallops or clicks. No pedal edema. Gastrointestinal system: Abdomen soft . Tender in the right lower quadrant. Stool in colostomy bag.  Central nervous system: Alert and oriented. No focal neurological deficits. Extremities: Symmetric 5 x 5 power. Skin: No rashes, lesions or ulcers Psychiatry: Judgement and insight appear normal. Mood & affect appropriate.     Data Reviewed: I have personally reviewed following labs and imaging studies  CBC:  Recent Labs Lab 03/07/17 1733 03/08/17 0524 03/09/17 0258 03/10/17 0606 03/11/17 1012  WBC 5.8 5.2 3.6* 4.1 6.4  NEUTROABS 5.0  --   --   --  5.2  HGB 12.9 11.7* 12.4 12.6 13.6  HCT 39.2 35.5* 38.1 40.2 43.1  MCV 89.3 89.4 90.5 91.2 91.3  PLT 283 228 233 200 196   Basic Metabolic Panel:  Recent Labs Lab 03/07/17 1733 03/08/17 0524 03/09/17 0258 03/10/17 0606 03/11/17 1012  NA 139 143 145 143 142  K 3.7 3.0* 3.0* 4.2 5.1  CL 104 111 112* 112* 117*  CO2 22 20* 23 25 18*  GLUCOSE 137* 137* 118* 193* 115*  BUN 46* 38* 31* 29* 32*  CREATININE 1.65* 1.17* 0.99 1.04* 1.39*  CALCIUM 9.6 8.6* 8.7* 8.3* 8.3*  MG  --  2.4  --  2.1  --    GFR: Estimated Creatinine Clearance: 21.8 mL/min (A) (by C-G formula based on SCr of 1.39 mg/dL (H)). Liver Function Tests:  Recent Labs Lab 03/04/17 1822  AST 24  ALT 11*  ALKPHOS 93  BILITOT 1.3*  PROT 7.3  ALBUMIN 4.1     Recent Labs Lab 03/04/17 1822  LIPASE 30   No results for input(s): AMMONIA in the last 168 hours. Coagulation Profile: No results for input(s): INR, PROTIME in the last 168 hours. Cardiac Enzymes: No results for input(s): CKTOTAL, CKMB, CKMBINDEX, TROPONINI in the last 168 hours. BNP (last 3 results) No results for input(s): PROBNP in the last 8760 hours. HbA1C: No results for input(s): HGBA1C in the last 72 hours. CBG:  Recent Labs Lab 03/10/17 0928 03/10/17 1919 03/11/17 0034 03/11/17 0813 03/11/17 1602  GLUCAP 170* 161* 126* 141* 137*   Lipid Profile: No results for input(s): CHOL, HDL, LDLCALC, TRIG, CHOLHDL, LDLDIRECT in the last 72 hours. Thyroid Function Tests: No results for input(s): TSH, T4TOTAL, FREET4, T3FREE, THYROIDAB in the last 72 hours. Anemia Panel: No results for input(s): VITAMINB12, FOLATE, FERRITIN, TIBC, IRON, RETICCTPCT in the last 72 hours. Sepsis Labs: No results for input(s): PROCALCITON, LATICACIDVEN in the last 168 hours.  Recent Results (from the past 240 hour(s))  MRSA PCR Screening     Status: None   Collection Time: 03/09/17 11:52 AM  Result Value Ref Range Status   MRSA by PCR NEGATIVE NEGATIVE Final    Comment:        The GeneXpert MRSA Assay (FDA approved for NASAL specimens only), is one component of a comprehensive MRSA colonization surveillance program. It is not intended to diagnose MRSA infection nor to guide or monitor treatment for MRSA infections.          Radiology Studies: No results found.      Scheduled Meds: . enoxaparin (LOVENOX) injection  30 mg Subcutaneous Q24H  . mouth rinse  15 mL Mouth Rinse BID   Continuous Infusions: . sodium chloride    . dextrose 5 % and 0.45% NaCl 100 mL/hr at 03/11/17 0856  . [START ON 03/12/2017] famotidine (PEPCID) IV    . lactated ringers 10 mL/hr at 03/09/17 1311  . sodium chloride       LOS: 4 days    Time spent: 35 minutes.     Hosie Poisson,  MD [facility] Pager [phone]  If 7PM-7AM, please contact night-coverage [email or web address] Password [email or web address] 03/11/2017, 4:46 PM

## 2017-03-11 NOTE — Consult Note (Addendum)
[place] Nurse ostomy follow up Stoma type/location: Colostomy stoma from surgery on 8/28 Stomal assessment/size: Stoma red and viable, above skin level, 1 1/2 inches.  Plastic rod through stoma creates a difficult pouching situation Peristomal assessment: intact skin surrounding Output: Mod amt brown semi-formed stool in the pouch  Ostomy pouching: 1pc.  Education provided:  Demonstrated pouch change to patient.  She is able to open and close velcro to empty.  No family members at the bedside.  Supplies in room for staff nurse use.  She will require total assistance with pouch application until rod is discontinued. Enrolled patient in [place] Start Discharge program: Yes Julien Girt MSN, RN, [place], [place], [place]

## 2017-03-11 NOTE — Care Management Note (Signed)
Case Management Note  Patient Details  Name: [name] MRN: [id number] Date of Birth: October 05, 1923  Subjective/Objective:    From [facility], presents with Large Bowel Obstruction, POD 2 expl lap with frozen section loop colostomy.  Advance diet, urine output is poor.  Per pt eval rec SNF.  CSW following.                  Action/Plan: NCM will follow along with CSW for dc needs.  Expected Discharge Date:  03/09/17               Expected Discharge Plan:  Skilled Nursing Facility  In-House Referral:  Clinical Social Work  Discharge planning Services  CM Consult  Post Acute Care Choice:    Choice offered to:     DME Arranged:    DME Agency:     HH Arranged:    [place] Agency:     Status of Service:  Completed, signed off  If discussed at [employer], dates discussed:    Additional Comments:  Zenon Mayo, RN 03/11/2017, 12:04 PM

## 2017-03-11 NOTE — Progress Notes (Signed)
New Admission Note:  Arrival Method: Bed  Mental Orientation: Alert and oriented x 4 Telemetry: Box 06 Assessment: Completed Skin: warm and dry,  IV: NSL  Pain: Denies  Tubes: Colostmy  Safety Measures: Safety Fall Prevention Plan was given, discussed and signed. Admission: Completed 2 [place]  Orientation: Patient has been orientated to the room, unit and the staff. Family: None   Orders have been reviewed and implemented. Will continue to monitor the patient. Call light has been placed within reach and bed alarm has been activated.   Sima Matas BSN, RN  Phone Number: [phone]

## 2017-03-11 NOTE — Progress Notes (Addendum)
[facility] Progress Note 2 Days Post-Op  Subjective: Patient having some abdominal pain particularly on the right side.    Objective: Vital signs in last 24 hours: Temp:  [97.7 F (36.5 C)-98.7 F (37.1 C)] 98.1 F (36.7 C) (08/30 0808) Pulse Rate:  [95-110] 110 (08/30 0400) Resp:  [16-22] 22 (08/30 0808) BP: (88-117)/(52-70) 99/62 (08/30 0808) SpO2:  [90 %-93 %] 91 % (08/30 0400) Last BM Date: 03/10/17  Intake/Output from previous day: 08/29 0701 - 08/30 0700 In: 2842.8 [I.V.:2542.8; IV Piggyback:300] Out: 1750 [Urine:150; JMEQA:8341] Intake/Output this shift: No intake/output data recorded.  General: No acute distress.  Very alert and oriented.  Lungs: Clear  Abd: Softer than yesterday.  Tender in the RLQ.  Excellent bowel sounds.  1600 cc stool output from colostomy  Extremities: No changes  Neuro: Intact  Lab Results:  @LABLAST2 (wbc:2,hgb:2,hct:2,plt:2) BMET ) Recent Labs  03/09/17 0258 03/10/17 0606  NA 145 143  K 3.0* 4.2  CL 112* 112*  CO2 23 25  GLUCOSE 118* 193*  BUN 31* 29*  CREATININE 0.99 1.04*  CALCIUM 8.7* 8.3*   PT/INR No results for input(s): LABPROT, INR in the last 72 hours. ABG No results for input(s): PHART, HCO3 in the last 72 hours.  Invalid input(s): PCO2, PO2  Studies/Results: No results found.  Anti-infectives: Anti-infectives    Start     Dose/Rate Route Frequency Ordered Stop   03/09/17 1230  cefoTEtan (CEFOTAN) 2 g in dextrose 5 % 50 mL IVPB  Status:  Discontinued     2 g 100 mL/hr over 30 Minutes Intravenous Every 12 hours 03/09/17 1122 03/09/17 1844      Assessment/Plan: s/p Procedure(s): EXPLORATORY LAPAROTOMY WITH FROZEN SECTION LOOP COLOSTOMY Transfer to the floor.  Diet increase  Urine output is poor and need bolus of saline.  LOS: 4 days   Kathryne Eriksson. Dahlia Bailiff, MD, FACS [phone] [facility] Surgery 03/11/2017

## 2017-03-11 NOTE — Care Management Important Message (Signed)
Important Message  Patient Details  Name: [name] MRN: [id number] Date of Birth: 1923-12-21   Medicare Important Message Given:  Yes    Bonni Neuser Abena 03/11/2017, 9:22 AM

## 2017-03-12 ENCOUNTER — Inpatient Hospital Stay (HOSPITAL_COMMUNITY): Payer: Medicare Other

## 2017-03-12 DIAGNOSIS — I4891 Unspecified atrial fibrillation: Secondary | ICD-10-CM

## 2017-03-12 LAB — ECHOCARDIOGRAM COMPLETE
HEIGHTINCHES: 63 in
WEIGHTICAEL: 2109.36 [oz_av]

## 2017-03-12 LAB — GLUCOSE, CAPILLARY
GLUCOSE-CAPILLARY: 95 mg/dL (ref 65–99)
Glucose-Capillary: 129 mg/dL — ABNORMAL HIGH (ref 65–99)
Glucose-Capillary: 166 mg/dL — ABNORMAL HIGH (ref 65–99)

## 2017-03-12 LAB — MAGNESIUM: MAGNESIUM: 1.8 mg/dL (ref 1.7–2.4)

## 2017-03-12 LAB — BASIC METABOLIC PANEL
Anion gap: 6 (ref 5–15)
BUN: 32 mg/dL — AB (ref 6–20)
CHLORIDE: 115 mmol/L — AB (ref 101–111)
CO2: 19 mmol/L — AB (ref 22–32)
CREATININE: 1.27 mg/dL — AB (ref 0.44–1.00)
Calcium: 8.1 mg/dL — ABNORMAL LOW (ref 8.9–10.3)
GFR calc Af Amer: 41 mL/min — ABNORMAL LOW (ref >60)
GFR calc non Af Amer: 36 mL/min — ABNORMAL LOW (ref >60)
GLUCOSE: 131 mg/dL — AB (ref 65–99)
POTASSIUM: 3.3 mmol/L — AB (ref 3.5–5.1)
SODIUM: 140 mmol/L (ref 135–145)

## 2017-03-12 LAB — PHOSPHORUS: Phosphorus: 2.8 mg/dL (ref 2.5–4.6)

## 2017-03-12 LAB — TROPONIN I
TROPONIN I: 0.03 ng/mL — AB (ref <0.03)
Troponin I: 0.05 ng/mL (ref <0.03)

## 2017-03-12 LAB — TSH: TSH: 2.117 u[IU]/mL (ref 0.350–4.500)

## 2017-03-12 MED ORDER — SODIUM CHLORIDE 0.9 % IV BOLUS (SEPSIS)
1000.0000 mL | Freq: Once | INTRAVENOUS | Status: AC
  Administered 2017-03-12: 1000 mL via INTRAVENOUS

## 2017-03-12 MED ORDER — ACETAMINOPHEN 160 MG/5ML PO SOLN
650.0000 mg | Freq: Four times a day (QID) | ORAL | Status: DC
  Administered 2017-03-12 – 2017-03-16 (×10): 650 mg via ORAL
  Filled 2017-03-12 (×13): qty 20.3

## 2017-03-12 MED ORDER — METOPROLOL TARTRATE 5 MG/5ML IV SOLN
2.5000 mg | INTRAVENOUS | Status: AC | PRN
  Administered 2017-03-12 (×3): 2.5 mg via INTRAVENOUS
  Filled 2017-03-12 (×2): qty 5

## 2017-03-12 MED ORDER — METOPROLOL TARTRATE 5 MG/5ML IV SOLN
2.5000 mg | INTRAVENOUS | Status: DC | PRN
  Administered 2017-03-13: 2.5 mg via INTRAVENOUS
  Filled 2017-03-12: qty 5

## 2017-03-12 MED ORDER — ENSURE ENLIVE PO LIQD
237.0000 mL | Freq: Two times a day (BID) | ORAL | Status: DC
  Administered 2017-03-12 – 2017-03-13 (×2): 237 mL via ORAL

## 2017-03-12 MED ORDER — HYDROMORPHONE HCL 1 MG/ML IJ SOLN: 0.5000 mg | INTRAMUSCULAR | Status: DC | PRN

## 2017-03-12 MED ORDER — CODEINE SULFATE 15 MG PO TABS: 15.0000 mg | ORAL_TABLET | ORAL | Status: DC | PRN

## 2017-03-12 MED ORDER — METOPROLOL TARTRATE 12.5 MG HALF TABLET
12.5000 mg | ORAL_TABLET | Freq: Two times a day (BID) | ORAL | Status: DC
  Administered 2017-03-12 – 2017-03-15 (×6): 12.5 mg via ORAL
  Filled 2017-03-12 (×9): qty 1

## 2017-03-12 MED ORDER — SODIUM CHLORIDE 0.9 % IV SOLN
INTRAVENOUS | Status: DC
  Administered 2017-03-12 – 2017-03-13 (×3): via INTRAVENOUS

## 2017-03-12 NOTE — Progress Notes (Signed)
3 Days Post-Op    CC:  Abdominal pain,constipation, nausea and vomiting    Subjective: Ostomy working well, still sore all over.  Has only been OOB to BR.    Objective: Vital signs in last 24 hours: Temp:  [97.5 F (36.4 C)-98.2 F (36.8 C)] 97.8 F (36.6 C) (08/31 0818) Pulse Rate:  [98-141] 116 (08/31 0818) Resp:  [17-19] 18 (08/31 0818) BP: (88-110)/(61-67) 88/65 (08/31 0818) SpO2:  [95 %-98 %] 96 % (08/31 0818) Weight:  [59.8 kg (131 lb 13.4 oz)] 59.8 kg (131 lb 13.4 oz) (08/30 2100) Last BM Date: 03/12/17 PO 60 IV 2100 Urine 200 NO NG  Stool 2200 Weight:  56.5>> 59.8 yesteday Afebrile, BP down Tachycardic No labs Troponin <0.03 x 2   Intake/Output from previous day: 08/30 0701 - 08/31 0700 In: 2166.7 [P.O.:60; I.V.:2106.7] Out: 2400 [Urine:200; Stool:2200] Intake/Output this shift: No intake/output data recorded.  General appearance: alert, cooperative and no distress Resp: clear to auscultation bilaterally GI: Ostomy working less distension. she says she is sore, but not really hurting.  .    Lab Results:   Recent Labs  03/10/17 0606 03/11/17 1012  WBC 4.1 6.4  HGB 12.6 13.6  HCT 40.2 43.1  PLT 200 199    BMET  Recent Labs  03/10/17 0606 03/11/17 1012  NA 143 142  K 4.2 5.1  CL 112* 117*  CO2 25 18*  GLUCOSE 193* 115*  BUN 29* 32*  CREATININE 1.04* 1.39*  CALCIUM 8.3* 8.3*   PT/INR No results for input(s): LABPROT, INR in the last 72 hours.  No results for input(s): AST, ALT, ALKPHOS, BILITOT, PROT, ALBUMIN in the last 168 hours.   Lipase     Component Value Date/Time   LIPASE 30 03/04/2017 1822     Medications: . enoxaparin (LOVENOX) injection  30 mg Subcutaneous Q24H  . mouth rinse  15 mL Mouth Rinse BID   . sodium chloride    . dextrose 5 % and 0.45% NaCl 100 mL/hr at 03/12/17 0303  . famotidine (PEPCID) IV 20 mg (03/12/17 0849)  . lactated ringers 10 mL/hr at 03/09/17 1311  . sodium chloride     Anti-infectives    Start     Dose/Rate Route Frequency Ordered Stop   03/09/17 1230  cefoTEtan (CEFOTAN) 2 g in dextrose 5 % 50 mL IVPB  Status:  Discontinued     2 g 100 mL/hr over 30 Minutes Intravenous Every 12 hours 03/09/17 1122 03/09/17 1844      Assessment/Plan Abdominal pain, constipation, nausea and vomiting High grade rectosigmoid obstruction Obstructive colon mass,with metastasis to the liver and peritoneium S/p exploratory laparotomy with diverting colostomy, 03/09/17,Dr. Judeth Horn, Colon bx negative, frozen section preliminary - Adenocarcinoma Flexible sigmoidoscopy 03/08/17: obstruction at 20-25 cm Chronic renal disease Chronic consipation Movement disorder Hx of CVA Bilateral hip replacements FEN: IV fluids/ clear liquids started yesterday.   ID: Pre op Cefotetan DVT: SCD/Lovenox   Plan:  Watch I/O closely, I will give her a fluid bolus this AM.  I have put her on daily weights, nutrition consult, OT/PT consults and work to mobilize her more.  Replace stool output with Saline.  Dr. Hulen Skains  gave her a fluid bolus earlier this AM.     LOS: 5 days    Korine Winton 03/12/2017 [phone]

## 2017-03-12 NOTE — Progress Notes (Signed)
No negative output, no bolus required for this shift

## 2017-03-12 NOTE — Progress Notes (Signed)
,  PROGRESS NOTE    [name]  JIR:[id number] DOB: May 16, 1924 DOA: 03/07/2017 PCP: Antony Contras, MD   Brief Narrative: [name] a 83 y.o.femalewith history of TIA presents to the ER because of persistent abdominal pain with nausea vomiting. She was found to have high grade rectosigmoid obstruction with a colon mass with mets to liver and peritoneum. She underwent exp lap with diverting colostomy on 8/28. Biopsy showed adeno carcinoma. Currently she is doing better, surgery recommended advancing his diet.   Assessment & Plan:   Principal Problem:   Large bowel obstruction (HCC) Active Problems:   History of CVA in adulthood   Bowel obstruction (HCC)   Colon mass:  causing rectosigmoid obstruction leading to symptoms.  Underwent exp lap with diverting colostomy.  Pain control and symptom management.  Surgery on board and appreciate their recommendations.  Started on clears last night and advancing diet as per surgery recommendations.  Biopsy from the colon mass came back negative for malignancy.     AKI: with stage 3 CKD: suspect from dehydration and no po intake.  On IV fluids with normal saline.  Repeat bmp ordered and is pending.  Improving.    Hypokalemia: replete as needed.   H/o TIA:  On aspirin at home, which is on hold for now.  Resume it once she is able to tolerate by mouth.    New onset a fib overnight, paroxysmal in nature:  Responding to metoprolol.  Echocardiogram ordered and serial tropnins are negative.  TSh wnl.  Suspect brought on by dehydration and electrolyte abnormalities Prn metoprolol ordered. .     DVT prophylaxis: scd's Code Status: DNR Family Communication: NONE at bedside.  Disposition Plan: transfer to floor today when bed available.  Pending PT eval. posisbly to SNF.    Consultants:   Surgery.    Procedures: exp lap   Antimicrobials: pre op antibiotics.    Subjective: ABD PAIN IMPROVED WITH TYLENOL TODAY.    Currently pain free.   Objective: Vitals:   03/12/17 0818 03/12/17 1020 03/12/17 1119 03/12/17 1246  BP: (!) 88/65 (!) 89/53 (!) 92/53 (!) 96/50  Pulse: (!) 116 (!) 133  (!) 111  Resp: 18     Temp: 97.8 F (36.6 C)     TempSrc: Oral     SpO2: 96%     Weight:      Height:        Intake/Output Summary (Last 24 hours) at 03/12/17 1435 Last data filed at 03/12/17 1400  Gross per 24 hour  Intake          2846.67 ml  Output             2650 ml  Net           196.67 ml   Filed Weights   03/08/17 1415 03/09/17 1851 03/11/17 2100  Weight: 56.2 kg (123 lb 15.7 oz) 59.4 kg (130 lb 15.3 oz) 59.8 kg (131 lb 13.4 oz)    Examination:  General exam: Appears calm and comfortable not in any distress.  Respiratory system: Clear to auscultation. Respiratory effort normal. No wheezing or rhonchi.  Cardiovascular system: S1 & S2 heard, RRR. No JVD, murmurs, rubs, gallops or clicks. No pedal edema. Gastrointestinal system: Abdomen soft . Tender in the right lower quadrant. Stool in colostomy bag.  Central nervous system: Alert and oriented. No focal neurological deficits. Extremities: Symmetric 5 x 5 power. Skin: No rashes, lesions or ulcers Psychiatry: Judgement and insight appear normal.  Mood & affect appropriate.     Data Reviewed: I have personally reviewed following labs and imaging studies  CBC:  Recent Labs Lab 03/07/17 1733 03/08/17 0524 03/09/17 0258 03/10/17 0606 03/11/17 1012  WBC 5.8 5.2 3.6* 4.1 6.4  NEUTROABS 5.0  --   --   --  5.2  HGB 12.9 11.7* 12.4 12.6 13.6  HCT 39.2 35.5* 38.1 40.2 43.1  MCV 89.3 89.4 90.5 91.2 91.3  PLT 283 228 233 200 106   Basic Metabolic Panel:  Recent Labs Lab 03/07/17 1733 03/08/17 0524 03/09/17 0258 03/10/17 0606 03/11/17 1012  NA 139 143 145 143 142  K 3.7 3.0* 3.0* 4.2 5.1  CL 104 111 112* 112* 117*  CO2 22 20* 23 25 18*  GLUCOSE 137* 137* 118* 193* 115*  BUN 46* 38* 31* 29* 32*  CREATININE 1.65* 1.17* 0.99 1.04* 1.39*   CALCIUM 9.6 8.6* 8.7* 8.3* 8.3*  MG  --  2.4  --  2.1  --    GFR: Estimated Creatinine Clearance: 21.8 mL/min (A) (by C-G formula based on SCr of 1.39 mg/dL (H)). Liver Function Tests: No results for input(s): AST, ALT, ALKPHOS, BILITOT, PROT, ALBUMIN in the last 168 hours. No results for input(s): LIPASE, AMYLASE in the last 168 hours. No results for input(s): AMMONIA in the last 168 hours. Coagulation Profile: No results for input(s): INR, PROTIME in the last 168 hours. Cardiac Enzymes:  Recent Labs Lab 03/12/17 0040 03/12/17 0524  TROPONINI <0.03 0.03*   BNP (last 3 results) No results for input(s): PROBNP in the last 8760 hours. HbA1C: No results for input(s): HGBA1C in the last 72 hours. CBG:  Recent Labs Lab 03/11/17 0813 03/11/17 1602 03/11/17 2111 03/12/17 0533 03/12/17 1209  GLUCAP 141* 137* 125* 129* 166*   Lipid Profile: No results for input(s): CHOL, HDL, LDLCALC, TRIG, CHOLHDL, LDLDIRECT in the last 72 hours. Thyroid Function Tests:  Recent Labs  03/12/17 0040  TSH 2.117   Anemia Panel: No results for input(s): VITAMINB12, FOLATE, FERRITIN, TIBC, IRON, RETICCTPCT in the last 72 hours. Sepsis Labs: No results for input(s): PROCALCITON, LATICACIDVEN in the last 168 hours.  Recent Results (from the past 240 hour(s))  MRSA PCR Screening     Status: None   Collection Time: 03/09/17 11:52 AM  Result Value Ref Range Status   MRSA by PCR NEGATIVE NEGATIVE Final    Comment:        The GeneXpert MRSA Assay (FDA approved for NASAL specimens only), is one component of a comprehensive MRSA colonization surveillance program. It is not intended to diagnose MRSA infection nor to guide or monitor treatment for MRSA infections.          Radiology Studies: No results found.      Scheduled Meds: . acetaminophen (TYLENOL) oral liquid 160 mg/5 mL  650 mg Oral Q6H  . enoxaparin (LOVENOX) injection  30 mg Subcutaneous Q24H  . feeding supplement  (ENSURE ENLIVE)  237 mL Oral BID BM  . mouth rinse  15 mL Mouth Rinse BID  . metoprolol tartrate  12.5 mg Oral BID   Continuous Infusions: . sodium chloride    . sodium chloride 100 mL/hr at 03/12/17 1024  . famotidine (PEPCID) IV Stopped (03/12/17 0919)  . lactated ringers 10 mL/hr at 03/09/17 1311  . sodium chloride       LOS: 5 days    Time spent: 35 minutes.     Hosie Poisson, MD [facility] Pager [phone]  If 7PM-7AM,  please contact night-coverage [email or web address] Password [email or web address] 03/12/2017, 2:35 PM

## 2017-03-12 NOTE — Progress Notes (Signed)
Bolus completed, systolic BP< 813, patient reverted back to NSR on tele. MD made aware, holding metoprolol until BP improves

## 2017-03-12 NOTE — Progress Notes (Signed)
Patient complaining of being awaken on and off. Explained to her that her heart rate is elevated and her rhythm have changed. " I dont care about my heart rate elevated. I just need a good night rest." You keep saying you will let me sleep but you dont."

## 2017-03-12 NOTE — Progress Notes (Signed)
Patient unable to void, unable to obtain accurate bladder scan due to incision, MD order for in and out cath which was 125 mL

## 2017-03-12 NOTE — Progress Notes (Signed)
CRITICAL VALUE ALERT  Critical Value:  Troponin 0.03  Date & Time Notied:  8/31 8:15 am  Provider Notified: Karleen [place]  Orders Received/Actions taken: 8:23 am Echo ordered   V/S 88/65, HR 130, O2: 95% RA; Resp 18

## 2017-03-12 NOTE — Progress Notes (Signed)
New onset Afib with HR of 130-140's. Will update MD .

## 2017-03-12 NOTE — Progress Notes (Signed)
Initial Nutrition Assessment  DOCUMENTATION CODES:   Severe malnutrition in context of chronic illness  INTERVENTION:   Ensure Enlive po BID, each supplement provides 350 kcal and 20 grams of protein  Magic cup BID with meals, each supplement provides 290 kcal and 9 grams of protein  NUTRITION DIAGNOSIS:   Malnutrition (Severe) related to chronic illness, cancer and cancer related treatments as evidenced by severe depletion of body fat, severe depletion of muscle mass.  GOAL:   Patient will meet greater than or equal to 90% of their needs  MONITOR:   PO intake, Supplement acceptance, Labs, Weight trends, Diet advancement  REASON FOR ASSESSMENT:   Consult Assessment of nutrition requirement/status  ASSESSMENT:   83 yo female admitted with abdominal pain, N/V with high grade rectosigmoid obstruction with colon mass with mets to liver and peritoneum. Pt with AKI with CKD III. Pt with hx of CVA  8/28 Ex lap with diverting colostomy  Tolerating sips of CL diet, advanced to FL today. Pt reports poor appetite. No N/V per pt. +ostomy output  Per weight encounters, no significant wt loss.   Nutrition-Focused physical exam completed. Findings are mild/moderate to severe fat depletion, mild/moderate to severe muscle depletion, and no edema.   Labs: Creatinine 1.32, potassium 5.1 (wdl) Meds: NS at 100 ml/hr  Diet Order:  Diet full liquid Room service appropriate? Yes; Fluid consistency: Thin  Skin:  Reviewed, no issues  Last BM:  8/31 colostomy  Height:   Ht Readings from Last 1 Encounters:  03/09/17 5\' 3"  (1.6 m)    Weight:   Wt Readings from Last 1 Encounters:  03/11/17 131 lb 13.4 oz (59.8 kg)    Ideal Body Weight:     BMI:  Body mass index is 23.35 kg/m.  Estimated Nutritional Needs:   Kcal:  1550-1770 kcals  Protein:  85-97 g  Fluid:  >/= 1.5 L  EDUCATION NEEDS:   No education needs identified at this time  [place], [place], LDN [phone] Pager  [phone] Weekend/On-Call Pager

## 2017-03-12 NOTE — Progress Notes (Signed)
CSW continuing to follow for transfer to [facility] when stable- spoke with admissions coordinator and confirmed they could take over wknd or holiday if pt is stable  Jorge Ny, [place] Social Worker [phone]

## 2017-03-12 NOTE — Progress Notes (Signed)
  Echocardiogram 2D Echocardiogram has been performed.  [name] 03/12/2017, 3:28 PM

## 2017-03-13 DIAGNOSIS — I4891 Unspecified atrial fibrillation: Secondary | ICD-10-CM | POA: Diagnosis not present

## 2017-03-13 LAB — CBC
HEMATOCRIT: 46 % (ref 36.0–46.0)
HEMOGLOBIN: 14.7 g/dL (ref 12.0–15.0)
MCH: 28.8 pg (ref 26.0–34.0)
MCHC: 32 g/dL (ref 30.0–36.0)
MCV: 90 fL (ref 78.0–100.0)
Platelets: 212 10*3/uL (ref 150–400)
RBC: 5.11 MIL/uL (ref 3.87–5.11)
RDW: 15.3 % (ref 11.5–15.5)
WBC: 14.5 10*3/uL — AB (ref 4.0–10.5)

## 2017-03-13 LAB — BASIC METABOLIC PANEL
ANION GAP: 5 (ref 5–15)
BUN: 30 mg/dL — ABNORMAL HIGH (ref 6–20)
CALCIUM: 8.7 mg/dL — AB (ref 8.9–10.3)
CHLORIDE: 117 mmol/L — AB (ref 101–111)
CO2: 20 mmol/L — ABNORMAL LOW (ref 22–32)
CREATININE: 1.19 mg/dL — AB (ref 0.44–1.00)
GFR calc non Af Amer: 39 mL/min — ABNORMAL LOW (ref >60)
GFR, EST AFRICAN AMERICAN: 45 mL/min — AB (ref >60)
Glucose, Bld: 145 mg/dL — ABNORMAL HIGH (ref 65–99)
Potassium: 2.7 mmol/L — CL (ref 3.5–5.1)
SODIUM: 142 mmol/L (ref 135–145)

## 2017-03-13 LAB — COMPREHENSIVE METABOLIC PANEL
ALBUMIN: 1.9 g/dL — AB (ref 3.5–5.0)
ALK PHOS: 138 U/L — AB (ref 38–126)
ALT: 126 U/L — ABNORMAL HIGH (ref 14–54)
ANION GAP: 11 (ref 5–15)
AST: 84 U/L — AB (ref 15–41)
BILIRUBIN TOTAL: 0.9 mg/dL (ref 0.3–1.2)
BUN: 32 mg/dL — AB (ref 6–20)
CALCIUM: 8.8 mg/dL — AB (ref 8.9–10.3)
CO2: 14 mmol/L — AB (ref 22–32)
Chloride: 119 mmol/L — ABNORMAL HIGH (ref 101–111)
Creatinine, Ser: 1.08 mg/dL — ABNORMAL HIGH (ref 0.44–1.00)
GFR calc Af Amer: 50 mL/min — ABNORMAL LOW (ref >60)
GFR calc non Af Amer: 44 mL/min — ABNORMAL LOW (ref >60)
GLUCOSE: 119 mg/dL — AB (ref 65–99)
POTASSIUM: 3.5 mmol/L (ref 3.5–5.1)
Sodium: 144 mmol/L (ref 135–145)
TOTAL PROTEIN: 4.8 g/dL — AB (ref 6.5–8.1)

## 2017-03-13 LAB — GLUCOSE, CAPILLARY
Glucose-Capillary: 117 mg/dL — ABNORMAL HIGH (ref 65–99)
Glucose-Capillary: 116 mg/dL — ABNORMAL HIGH (ref 65–99)
Glucose-Capillary: 124 mg/dL — ABNORMAL HIGH (ref 65–99)
Glucose-Capillary: 135 mg/dL — ABNORMAL HIGH (ref 65–99)

## 2017-03-13 LAB — PREALBUMIN: Prealbumin: <5 mg/dL — ABNORMAL LOW (ref 18–38)

## 2017-03-13 LAB — TROPONIN I: Troponin I: 0.57 ng/mL (ref <0.03)

## 2017-03-13 LAB — MAGNESIUM: MAGNESIUM: 1.7 mg/dL (ref 1.7–2.4)

## 2017-03-13 MED ORDER — SODIUM CHLORIDE 0.9 % IV BOLUS (SEPSIS)
500.0000 mL | Freq: Once | INTRAVENOUS | Status: AC
  Administered 2017-03-13: 500 mL via INTRAVENOUS

## 2017-03-13 MED ORDER — POTASSIUM CHLORIDE 10 MEQ/100ML IV SOLN
10.0000 meq | INTRAVENOUS | Status: AC
  Administered 2017-03-13 (×3): 10 meq via INTRAVENOUS
  Filled 2017-03-13 (×3): qty 100

## 2017-03-13 MED ORDER — POTASSIUM CHLORIDE CRYS ER 20 MEQ PO TBCR
40.0000 meq | EXTENDED_RELEASE_TABLET | Freq: Two times a day (BID) | ORAL | Status: AC
  Administered 2017-03-13: 40 meq via ORAL
  Filled 2017-03-13 (×2): qty 2

## 2017-03-13 MED ORDER — DILTIAZEM HCL 25 MG/5ML IV SOLN
20.0000 mg | Freq: Once | INTRAVENOUS | Status: AC
  Administered 2017-03-13: 20 mg via INTRAVENOUS
  Filled 2017-03-13: qty 5

## 2017-03-13 MED ORDER — DILTIAZEM HCL 100 MG IV SOLR
5.0000 mg/h | INTRAVENOUS | Status: DC
  Administered 2017-03-13 – 2017-03-14 (×2): 5 mg/h via INTRAVENOUS
  Filled 2017-03-13 (×2): qty 100

## 2017-03-13 MED ORDER — METOPROLOL TARTRATE 5 MG/5ML IV SOLN: 5.0000 mg | INTRAVENOUS | Status: DC | PRN

## 2017-03-13 MED ORDER — FAMOTIDINE 20 MG PO TABS
20.0000 mg | ORAL_TABLET | Freq: Every day | ORAL | Status: DC
  Administered 2017-03-14 – 2017-03-16 (×3): 20 mg via ORAL
  Filled 2017-03-13 (×3): qty 1

## 2017-03-13 MED ORDER — MAGNESIUM SULFATE 2 GM/50ML IV SOLN
2.0000 g | Freq: Once | INTRAVENOUS | Status: AC
  Administered 2017-03-13: 2 g via INTRAVENOUS
  Filled 2017-03-13 (×2): qty 50

## 2017-03-13 NOTE — Progress Notes (Addendum)
,  PROGRESS NOTE    [name]  HDQ:[id number] DOB: 08/11/1923 DOA: 03/07/2017 PCP: Antony Contras, MD   Brief Narrative: [name] a 83 y.o.femalewith history of TIA presents to the ER because of persistent abdominal pain with nausea vomiting. She was found to have high grade rectosigmoid obstruction with a colon mass with mets to liver and peritoneum. She underwent exp lap with diverting colostomy on 8/28.  Currently she is doing better, surgery recommended advancing his diet.   Assessment & Plan:   Principal Problem:   Large bowel obstruction (HCC) Active Problems:   History of CVA in adulthood   Bowel obstruction (HCC)   Colon mass:  causing rectosigmoid obstruction leading to symptoms.  Underwent exp lap with diverting colostomy.  Pain control and symptom management.  Surgery on board and appreciate their recommendations.  Started on clears last night and advancing diet as per surgery recommendations.  Biopsy from the colon mass came back negative for malignancy.     AKI: with stage 3 CKD: suspect from dehydration and no po intake.  On IV fluids with normal saline.  Repeat bmp ordered and shows improvement.     Hypokalemia: replete as needed. Repeat in am.   H/o TIA:  On aspirin at home, which is on hold for now.  Resume it once she is able to tolerate by mouth.    New onset a fib overnight, paroxysmal in nature:  Initially responded to IV metoprolol, and converted to sinus, she went back into afib and required cardizem gtt.  Transfer to step down.  Echocardiogram ordered and troponin elevated at 0.57.  TSh wnl.  Suspect brought on by dehydration and electrolyte abnormalities   Leukocytosis:  Unclear etiology, cxr ordered.    Elevated liver function test:  Unclear etiology. She reports some mild abdominal discomfort from the surgery.   DVT prophylaxis: lovenox.  Code Status: DNR Family Communication: NONE at bedside.  Disposition Plan: transfer  to SNF.     Consultants:   Surgery.    Procedures: exp lap   Antimicrobials: pre op antibiotics.    Subjective: No chest pain or sob.  No palpitations.  No fever or chills.   Objective: Vitals:   03/13/17 1320 03/13/17 1356 03/13/17 1417 03/13/17 1547  BP: 106/60 97/65 (!) 105/57 105/61  Pulse:    (!) 103  Resp:      Temp:      TempSrc:      SpO2:      Weight:      Height:        Intake/Output Summary (Last 24 hours) at 03/13/17 1654 Last data filed at 03/13/17 1300  Gross per 24 hour  Intake          4259.96 ml  Output             2075 ml  Net          2184.96 ml   Filed Weights   03/09/17 1851 03/11/17 2100 03/12/17 1900  Weight: 59.4 kg (130 lb 15.3 oz) 59.8 kg (131 lb 13.4 oz) 62.6 kg (138 lb)    Examination:  General exam: Appears calm and comfortable not in any distress.  Respiratory system: no wheezing or rhonchi. Good air entry.  Cardiovascular system: S1 & S2 heard, RRR. No JVD, murmurs, rubs, gallops or clicks. No pedal edema. Gastrointestinal system: Abdomen soft . midly tender in the lower quadrant. Liquid stool in the colostomy bag Central nervous system: Alert and oriented. No focal  neurological deficits. Extremities: Symmetric 5 x 5 power. Skin: No rashes, lesions or ulcers Psychiatry:  Mood & affect appropriate.     Data Reviewed: I have personally reviewed following labs and imaging studies  CBC:  Recent Labs Lab 03/07/17 1733 03/08/17 0524 03/09/17 0258 03/10/17 0606 03/11/17 1012 03/13/17 0508  WBC 5.8 5.2 3.6* 4.1 6.4 14.5*  NEUTROABS 5.0  --   --   --  5.2  --   HGB 12.9 11.7* 12.4 12.6 13.6 14.7  HCT 39.2 35.5* 38.1 40.2 43.1 46.0  MCV 89.3 89.4 90.5 91.2 91.3 90.0  PLT 283 228 233 200 199 970   Basic Metabolic Panel:  Recent Labs Lab 03/08/17 0524  03/10/17 0606 03/11/17 1012 03/12/17 1303 03/13/17 0508 03/13/17 1048 03/13/17 1329  NA 143  < > 143 142 140 144 142  --   K 3.0*  < > 4.2 5.1 3.3* 3.5 2.7*  --    CL 111  < > 112* 117* 115* 119* 117*  --   CO2 20*  < > 25 18* 19* 14* 20*  --   GLUCOSE 137*  < > 193* 115* 131* 119* 145*  --   BUN 38*  < > 29* 32* 32* 32* 30*  --   CREATININE 1.17*  < > 1.04* 1.39* 1.27* 1.08* 1.19*  --   CALCIUM 8.6*  < > 8.3* 8.3* 8.1* 8.8* 8.7*  --   MG 2.4  --  2.1  --  1.8  --   --  1.7  PHOS  --   --   --   --  2.8  --   --   --   < > = values in this interval not displayed. GFR: Estimated Creatinine Clearance: 25.5 mL/min (A) (by C-G formula based on SCr of 1.19 mg/dL (H)). Liver Function Tests:  Recent Labs Lab 03/13/17 0508  AST 84*  ALT 126*  ALKPHOS 138*  BILITOT 0.9  PROT 4.8*  ALBUMIN 1.9*   No results for input(s): LIPASE, AMYLASE in the last 168 hours. No results for input(s): AMMONIA in the last 168 hours. Coagulation Profile: No results for input(s): INR, PROTIME in the last 168 hours. Cardiac Enzymes:  Recent Labs Lab 03/12/17 0040 03/12/17 0524 03/12/17 1303 03/13/17 1329  TROPONINI <0.03 0.03* 0.05* 0.57*   BNP (last 3 results) No results for input(s): PROBNP in the last 8760 hours. HbA1C: No results for input(s): HGBA1C in the last 72 hours. CBG:  Recent Labs Lab 03/12/17 0533 03/12/17 1209 03/12/17 2036 03/13/17 0424 03/13/17 0837  GLUCAP 129* 166* 95 117* 116*   Lipid Profile: No results for input(s): CHOL, HDL, LDLCALC, TRIG, CHOLHDL, LDLDIRECT in the last 72 hours. Thyroid Function Tests:  Recent Labs  03/12/17 0040  TSH 2.117   Anemia Panel: No results for input(s): VITAMINB12, FOLATE, FERRITIN, TIBC, IRON, RETICCTPCT in the last 72 hours. Sepsis Labs: No results for input(s): PROCALCITON, LATICACIDVEN in the last 168 hours.  Recent Results (from the past 240 hour(s))  MRSA PCR Screening     Status: None   Collection Time: 03/09/17 11:52 AM  Result Value Ref Range Status   MRSA by PCR NEGATIVE NEGATIVE Final    Comment:        The GeneXpert MRSA Assay (FDA approved for NASAL specimens only), is  one component of a comprehensive MRSA colonization surveillance program. It is not intended to diagnose MRSA infection nor to guide or monitor treatment for MRSA infections.  Radiology Studies: No results found.      Scheduled Meds: . acetaminophen (TYLENOL) oral liquid 160 mg/5 mL  650 mg Oral Q6H  . enoxaparin (LOVENOX) injection  30 mg Subcutaneous Q24H  . [START ON 03/14/2017] famotidine  20 mg Oral Daily  . feeding supplement (ENSURE ENLIVE)  237 mL Oral BID BM  . mouth rinse  15 mL Mouth Rinse BID  . metoprolol tartrate  12.5 mg Oral BID  . potassium chloride  40 mEq Oral BID   Continuous Infusions: . sodium chloride    . sodium chloride 100 mL/hr at 03/13/17 0605  . diltiazem (CARDIZEM) infusion 5 mg/hr (03/13/17 1141)  . lactated ringers 10 mL/hr at 03/09/17 1311  . magnesium sulfate 1 - 4 g bolus IVPB    . sodium chloride       LOS: 6 days    Time spent: 35 minutes.     Hosie Poisson, MD [facility] Pager [phone]  If 7PM-7AM, please contact night-coverage [email or web address] Password [facility] 03/13/2017, 4:54 PM

## 2017-03-13 NOTE — Progress Notes (Addendum)
Rapid response called to expedite patients transfer to stepdown.  Patient placement contacted, no stepdown beds available.

## 2017-03-13 NOTE — Progress Notes (Signed)
Lab called at 17:45 critical value troponin 0.57

## 2017-03-13 NOTE — Progress Notes (Signed)
OT Cancellation Note  Patient Details Name: [name] MRN: [id number] DOB: May 07, 1924   Cancelled Treatment:    Reason Eval/Treat Not Completed: Fatigue/lethargy limiting ability to participate. Pt declined to participate in therapy session this morning. Will re-attempt this afternoon if time allows.  Redmond Baseman, MS, OTR/L 03/13/2017, 12:27 PM

## 2017-03-13 NOTE — Progress Notes (Signed)
4 Days Post-Op   Subjective/Chief Complaint: No nausea   Objective: Vital signs in last 24 hours: Temp:  [97.7 F (36.5 C)-98.5 F (36.9 C)] 97.7 F (36.5 C) (09/01 0841) Pulse Rate:  [95-149] 126 (09/01 0841) Resp:  [18-20] 18 (09/01 0841) BP: (89-135)/(50-81) 121/56 (09/01 0841) SpO2:  [98 %-100 %] 98 % (09/01 0841) Weight:  [62.6 kg (138 lb)] 62.6 kg (138 lb) (08/31 1900) Last BM Date: 03/13/17  Intake/Output from previous day: 08/31 0701 - 09/01 0700 In: 4800 [P.O.:1040; I.V.:3260; IV Piggyback:400] Out: 1625 [Urine:325; Emesis/NG output:900; Stool:400] Intake/Output this shift: Total I/O In: -  Out: 400 [Stool:400]  General appearance: cooperative GI: soft, stoma pink with some liquid output, dressing with dry stain, +BS  Lab Results:   Recent Labs  03/11/17 1012 03/13/17 0508  WBC 6.4 14.5*  HGB 13.6 14.7  HCT 43.1 46.0  PLT 199 212   BMET  Recent Labs  03/12/17 1303 03/13/17 0508  NA 140 144  K 3.3* 3.5  CL 115* 119*  CO2 19* 14*  GLUCOSE 131* 119*  BUN 32* 32*  CREATININE 1.27* 1.08*  CALCIUM 8.1* 8.8*   PT/INR No results for input(s): LABPROT, INR in the last 72 hours. ABG No results for input(s): PHART, HCO3 in the last 72 hours.  Invalid input(s): PCO2, PO2  Studies/Results: No results found.  Anti-infectives: Anti-infectives    Start     Dose/Rate Route Frequency Ordered Stop   03/09/17 1230  cefoTEtan (CEFOTAN) 2 g in dextrose 5 % 50 mL IVPB  Status:  Discontinued     2 g 100 mL/hr over 30 Minutes Intravenous Every 12 hours 03/09/17 1122 03/09/17 1844      Assessment/Plan: s/p Procedure(s): EXPLORATORY LAPAROTOMY WITH FROZEN SECTION (N/A) LOOP COLOSTOMY High grade rectosigmoid obstruction Obstructive colon mass,with metastasis to the liver and peritoneium S/p exploratory laparotomy with diverting colostomy, 03/09/17,Dr. Judeth Horn - clears  LOS: 6 days    [name] E 03/13/2017

## 2017-03-13 NOTE — Progress Notes (Addendum)
Called to hang cardizem gtt.  Patient sleeping on my arrival , denies any complaints.  HR 111/71, HR 130-140's.  20 mg cardizem given IV and drip started at 5 mg.  Hr uncahnged 138, 105/54.  Patient will transfer to SDU when bed available, RN to monitor patient

## 2017-03-13 NOTE — Progress Notes (Signed)
PHARMACIST - PHYSICIAN COMMUNICATION   CONCERNING: IV to Oral Route Change Policy  RECOMMENDATION: This patient is receiving Pepcid by the intravenous route.  Based on criteria approved by the Pharmacy and Therapeutics Committee, the intravenous medication(s) is/are being converted to the equivalent oral dose form(s).   DESCRIPTION: These criteria include:  The patient is eating (either orally or via tube) and/or has been taking other orally administered medications for a least 24 hours  The patient has no evidence of active gastrointestinal bleeding or impaired GI absorption (gastrectomy, short bowel, patient on TNA or NPO).  If you have questions about this conversion, please contact the Pharmacy Department  []   ( [phone] )  [name] []   [phone] )  [name] [x]   [phone] )  [name] []   [phone] )  [name] []   [phone] )  [place], [facility] 03/13/2017 8:37 AM

## 2017-03-13 NOTE — Consult Note (Signed)
[place] Nurse ostomy follow up Stoma type/location: LLQ Colostomy, pouch change performed today with assistance.  Discharge plan is to SNF.  Patient able to open and close, but cognitively not able to understand to empty when 1/3 full.  We attempted this AM and she was unable to complete this task.  Will need ongoing education.   Stomal assessment/size: 1 3/4" 1 piece flat pouch to accommodate retention rod.  Small piece of the cut barrier is placed from 3 to 9 'oclock due to an uneven abdominal plane.  This works well.  Rod is manipulated and fits within the pouch.   Peristomal assessment: Some erythema and medical adhesive related skin injury at 7 o'clock.  Another piece of cut barrier is placed here to protect.  Treatment options for stomal/peristomal skin: Barrier ring (But would need to be flattened significantly- I used the trimmed barrier ) and 1 piece flat pouch.  Once rod is removed, a convex pouch would be ideal, but would likely leak at this time. Will order 1 flat and 1 convex for next pouch change.  Output Liquid brown stool Ostomy pouching: 1pc. Flat with cut barrier to serve as a flat ring. Education provided: Patient participates in care but is intermittently confused.  Is in a low bed and trying to get OOB without assistance.  Reminded to always call for assistance.  States, " I would like to leave" .  Understands she will be discharged to rehab for assistance and is amenable to this.  Enrolled patient in [place] Start Discharge program: Yes [place] team will follow.  Domenic Moras RN BSN [place] Pager [phone]

## 2017-03-14 ENCOUNTER — Inpatient Hospital Stay (HOSPITAL_COMMUNITY): Payer: Medicare Other

## 2017-03-14 DIAGNOSIS — I4891 Unspecified atrial fibrillation: Secondary | ICD-10-CM

## 2017-03-14 DIAGNOSIS — C189 Malignant neoplasm of colon, unspecified: Secondary | ICD-10-CM

## 2017-03-14 DIAGNOSIS — K56609 Unspecified intestinal obstruction, unspecified as to partial versus complete obstruction: Secondary | ICD-10-CM

## 2017-03-14 DIAGNOSIS — Z8673 Personal history of transient ischemic attack (TIA), and cerebral infarction without residual deficits: Secondary | ICD-10-CM

## 2017-03-14 LAB — BASIC METABOLIC PANEL
Anion gap: 10 (ref 5–15)
BUN: 30 mg/dL — AB (ref 6–20)
CHLORIDE: 119 mmol/L — AB (ref 101–111)
CO2: 16 mmol/L — AB (ref 22–32)
CREATININE: 0.98 mg/dL (ref 0.44–1.00)
Calcium: 8.7 mg/dL — ABNORMAL LOW (ref 8.9–10.3)
GFR calc Af Amer: 57 mL/min — ABNORMAL LOW (ref >60)
GFR calc non Af Amer: 49 mL/min — ABNORMAL LOW (ref >60)
Glucose, Bld: 122 mg/dL — ABNORMAL HIGH (ref 65–99)
POTASSIUM: 3.2 mmol/L — AB (ref 3.5–5.1)
SODIUM: 145 mmol/L (ref 135–145)

## 2017-03-14 LAB — HEPATIC FUNCTION PANEL
ALBUMIN: 1.7 g/dL — AB (ref 3.5–5.0)
ALK PHOS: 121 U/L (ref 38–126)
ALT: 157 U/L — ABNORMAL HIGH (ref 14–54)
AST: 98 U/L — AB (ref 15–41)
BILIRUBIN TOTAL: 0.9 mg/dL (ref 0.3–1.2)
Bilirubin, Direct: 0.3 mg/dL (ref 0.1–0.5)
Indirect Bilirubin: 0.6 mg/dL (ref 0.3–0.9)
TOTAL PROTEIN: 4.8 g/dL — AB (ref 6.5–8.1)

## 2017-03-14 LAB — CBC
HEMATOCRIT: 44.4 % (ref 36.0–46.0)
HEMOGLOBIN: 15 g/dL (ref 12.0–15.0)
MCH: 29.5 pg (ref 26.0–34.0)
MCHC: 33.8 g/dL (ref 30.0–36.0)
MCV: 87.2 fL (ref 78.0–100.0)
Platelets: 178 10*3/uL (ref 150–400)
RBC: 5.09 MIL/uL (ref 3.87–5.11)
RDW: 15.2 % (ref 11.5–15.5)
WBC: 11.9 10*3/uL — ABNORMAL HIGH (ref 4.0–10.5)

## 2017-03-14 LAB — GLUCOSE, CAPILLARY
GLUCOSE-CAPILLARY: 106 mg/dL — AB (ref 65–99)
GLUCOSE-CAPILLARY: 131 mg/dL — AB (ref 65–99)
GLUCOSE-CAPILLARY: 103 mg/dL — AB (ref 65–99)

## 2017-03-14 LAB — MAGNESIUM
MAGNESIUM: 2.2 mg/dL (ref 1.7–2.4)
Magnesium: UNDETERMINED mg/dL (ref 1.7–2.4)

## 2017-03-14 MED ORDER — POTASSIUM CHLORIDE CRYS ER 20 MEQ PO TBCR
20.0000 meq | EXTENDED_RELEASE_TABLET | Freq: Once | ORAL | Status: AC
  Administered 2017-03-14: 20 meq via ORAL
  Filled 2017-03-14: qty 1

## 2017-03-14 MED ORDER — POTASSIUM CHLORIDE 10 MEQ/100ML IV SOLN
10.0000 meq | INTRAVENOUS | Status: AC
  Administered 2017-03-14 (×2): 10 meq via INTRAVENOUS
  Filled 2017-03-14 (×2): qty 100

## 2017-03-14 MED ORDER — DILTIAZEM HCL ER COATED BEADS 120 MG PO CP24
120.0000 mg | ORAL_CAPSULE | Freq: Every day | ORAL | Status: DC
  Administered 2017-03-14 – 2017-03-16 (×3): 120 mg via ORAL
  Filled 2017-03-14 (×3): qty 1

## 2017-03-14 NOTE — Clinical Social Work Note (Signed)
CSW continuing to follow for medical stability.   [place], [place]

## 2017-03-14 NOTE — Progress Notes (Signed)
,  PROGRESS NOTE    [name]  WEX:[id number] DOB: 02-Jun-1924 DOA: 03/07/2017 PCP: Antony Contras, MD   Brief Narrative: [name] a 84 y.o.femalewith history of TIA presents to the ER because of persistent abdominal pain with nausea vomiting. She was found to have high grade rectosigmoid obstruction with a colon mass with mets to liver and peritoneum. She underwent exp lap with diverting colostomy on 8/28.  Currently she is doing better, surgery recommended advancing his diet.   Assessment & Plan:   Principal Problem:   Large bowel obstruction (HCC) Active Problems:   History of CVA in adulthood   Bowel obstruction (HCC)   Atrial fibrillation with RVR (HCC)   Colon mass:  causing rectosigmoid obstruction leading to symptoms.  Underwent exp lap with diverting colostomy.  Pain control and symptom management.  Surgery on board and appreciate their recommendations.  Started on clears last night and advancing diet as per surgery recommendations.  Biopsy from the colon mass came back negative for malignancy.     AKI: with stage 3 CKD: suspect from dehydration and decreased po intake.  On IV fluids with normal saline.  Repeat bmp ordered and shows improvement.     Hypokalemia: replete as needed. Keep it greater than 4. And magnesium greater than 2.   H/o TIA:  On aspirin at home, which is on hold for now.  Resume it once she is able to tolerate by mouth.    New onset a fib overnight, paroxysmal in nature:  Initially responded to IV metoprolol, and converted to sinus, she went back into afib and required cardizem gtt, transitioned to oral cardizem.  Transferred to step down.  Echocardiogram ordered showed grade 1 diastolic dysfunction, and troponin elevated at 0.57.  TSh wnl.  Suspect brought on by dehydration and electrolyte abnormalities. Since her [place] 2 VASC is around 5, discussed with daughter on 9/1 about anticoagulation. She agreed to go ahead with anti  coagulation, but her mom today did not want to be started on , as per conversations with cardiology.  Will call her daughter tonight and start her on eliquis and if okay with surgery.     Leukocytosis:  Unclear etiology, cxr ordered pending. Low grade temp and chills.     Elevated liver function test:  Unclear etiology. She reports some mild abdominal discomfort from the surgery.  Repeat liver function tests in am.   DVT prophylaxis: lovenox.  Code Status: DNR Family Communication: NONE at bedside.  Disposition Plan: SNF in 1 to 2  Days.    Consultants:   Surgery.   Cardiology     Procedures: exp lap   Antimicrobials: pre op antibiotics.    Subjective: abd discomfort resolved.  No chest pain or sob.   Objective: Vitals:   03/13/17 1930 03/13/17 2254 03/14/17 0328 03/14/17 0939  BP: 95/66 108/68 113/67 (!) 106/55  Pulse: (!) 101 77 (!) 40 99  Resp: 19  18   Temp: 98.9 F (37.2 C) 97.6 F (36.4 C) 97.7 F (36.5 C)   TempSrc: Oral Oral Oral   SpO2: 98% 93% 97%   Weight:   63.5 kg (140 lb)   Height:        Intake/Output Summary (Last 24 hours) at 03/14/17 0944 Last data filed at 03/14/17 0700  Gross per 24 hour  Intake          2460.58 ml  Output             2020 ml  Net           440.58 ml   Filed Weights   03/11/17 2100 03/12/17 1900 03/14/17 0328  Weight: 59.8 kg (131 lb 13.4 oz) 62.6 kg (138 lb) 63.5 kg (140 lb)    Examination:  General exam: Appears calm and comfortable not in any distress.  Respiratory system: clear to auscultation, no wheezing or rhonchi.  Cardiovascular system: S1 & S2 heard, RRR. No JVD, murmurs, rubs, gallops or clicks. No pedal edema. Gastrointestinal system: soft non tender, colostomy bag with liquid stool.  Central nervous system: Alert and oriented to place and person. No focal neurological deficits. Extremities: Symmetric 5 x 5 power. Skin: No rashes, lesions or ulcers Psychiatry:  Mood & affect appropriate.      Data Reviewed: I have personally reviewed following labs and imaging studies  CBC:  Recent Labs Lab 03/07/17 1733  03/09/17 0258 03/10/17 0606 03/11/17 1012 03/13/17 0508 03/14/17 0451  WBC 5.8  < > 3.6* 4.1 6.4 14.5* 11.9*  NEUTROABS 5.0  --   --   --  5.2  --   --   HGB 12.9  < > 12.4 12.6 13.6 14.7 15.0  HCT 39.2  < > 38.1 40.2 43.1 46.0 44.4  MCV 89.3  < > 90.5 91.2 91.3 90.0 87.2  PLT 283  < > 233 200 199 212 178  < > = values in this interval not displayed. Basic Metabolic Panel:  Recent Labs Lab 03/08/17 0524  03/10/17 0606 03/11/17 1012 03/12/17 1303 03/13/17 0508 03/13/17 1048 03/13/17 1329 03/14/17 0451  NA 143  < > 143 142 140 144 142  --  145  K 3.0*  < > 4.2 5.1 3.3* 3.5 2.7*  --  3.2*  CL 111  < > 112* 117* 115* 119* 117*  --  119*  CO2 20*  < > 25 18* 19* 14* 20*  --  16*  GLUCOSE 137*  < > 193* 115* 131* 119* 145*  --  122*  BUN 38*  < > 29* 32* 32* 32* 30*  --  30*  CREATININE 1.17*  < > 1.04* 1.39* 1.27* 1.08* 1.19*  --  0.98  CALCIUM 8.6*  < > 8.3* 8.3* 8.1* 8.8* 8.7*  --  8.7*  MG 2.4  --  2.1  --  1.8  --   --  1.7  --   PHOS  --   --   --   --  2.8  --   --   --   --   < > = values in this interval not displayed. GFR: Estimated Creatinine Clearance: 33.5 mL/min (by C-G formula based on SCr of 0.98 mg/dL). Liver Function Tests:  Recent Labs Lab 03/13/17 0508  AST 84*  ALT 126*  ALKPHOS 138*  BILITOT 0.9  PROT 4.8*  ALBUMIN 1.9*   No results for input(s): LIPASE, AMYLASE in the last 168 hours. No results for input(s): AMMONIA in the last 168 hours. Coagulation Profile: No results for input(s): INR, PROTIME in the last 168 hours. Cardiac Enzymes:  Recent Labs Lab 03/12/17 0040 03/12/17 0524 03/12/17 1303 03/13/17 1329  TROPONINI <0.03 0.03* 0.05* 0.57*   BNP (last 3 results) No results for input(s): PROBNP in the last 8760 hours. HbA1C: No results for input(s): HGBA1C in the last 72 hours. CBG:  Recent Labs Lab  03/13/17 0424 03/13/17 0837 03/13/17 1748 03/13/17 1954 03/14/17 0329  GLUCAP 117* 116* 124* 135* 106*   Lipid Profile:  No results for input(s): CHOL, HDL, LDLCALC, TRIG, CHOLHDL, LDLDIRECT in the last 72 hours. Thyroid Function Tests:  Recent Labs  03/12/17 0040  TSH 2.117   Anemia Panel: No results for input(s): VITAMINB12, FOLATE, FERRITIN, TIBC, IRON, RETICCTPCT in the last 72 hours. Sepsis Labs: No results for input(s): PROCALCITON, LATICACIDVEN in the last 168 hours.  Recent Results (from the past 240 hour(s))  MRSA PCR Screening     Status: None   Collection Time: 03/09/17 11:52 AM  Result Value Ref Range Status   MRSA by PCR NEGATIVE NEGATIVE Final    Comment:        The GeneXpert MRSA Assay (FDA approved for NASAL specimens only), is one component of a comprehensive MRSA colonization surveillance program. It is not intended to diagnose MRSA infection nor to guide or monitor treatment for MRSA infections.          Radiology Studies: No results found.      Scheduled Meds: . acetaminophen (TYLENOL) oral liquid 160 mg/5 mL  650 mg Oral Q6H  . enoxaparin (LOVENOX) injection  30 mg Subcutaneous Q24H  . famotidine  20 mg Oral Daily  . feeding supplement (ENSURE ENLIVE)  237 mL Oral BID BM  . mouth rinse  15 mL Mouth Rinse BID  . metoprolol tartrate  12.5 mg Oral BID  . potassium chloride  40 mEq Oral BID   Continuous Infusions: . sodium chloride    . sodium chloride 100 mL/hr at 03/13/17 0605  . diltiazem (CARDIZEM) infusion 5 mg/hr (03/14/17 0006)  . lactated ringers 10 mL/hr at 03/09/17 1311  . sodium chloride       LOS: 7 days    Time spent: 35 minutes.     Hosie Poisson, MD [facility] Pager [phone]  If 7PM-7AM, please contact night-coverage [email or web address] Password [facility] 03/14/2017, 9:44 AM

## 2017-03-14 NOTE — Evaluation (Signed)
Occupational Therapy Evaluation Patient Details Name: [name] MRN: [id number] DOB: 09-May-1924 Today's Date: 03/14/2017    History of Present Illness 83 y.o. female with history of TIA, arthritis and movement disorder admitted with high grade rectosigmoid obstruction with a colon mass with mets to liver and peritoneum. She underwent exp lap with diverting colostomy on 8/28. Biopsy showed adeno carcinoma.   Clinical Impression   Per RN, pt was independent with Rollator for basic ADL PTA. She currently requires mod assist for LB ADL, min assist for UB ADL, and heavy mod assist for stand-pivot simulated toilet transfers. Pt pleasantly confused during session with decreased problem solving and sequencing skills and intermittent hallucination during session. She would benefit from continued OT services while admitted to improve independence with ADL and functional mobility and recommend SNF placement for continued rehabilitation post-acute D/C. Will continue to follow.     Follow Up Recommendations  SNF;Supervision/Assistance - 24 hour    Equipment Recommendations  Other (comment) (TBD at next venue of care)    Recommendations for Other Services       Precautions / Restrictions Precautions Precautions: Fall Restrictions Weight Bearing Restrictions: No      Mobility Bed Mobility Overal bed mobility: Needs Assistance Bed Mobility: Rolling;Sidelying to Sit Rolling: Mod assist Sidelying to sit: Mod assist       General bed mobility comments: Multimodal cues for sequencing and pt poorly able to complete with proper technique due to decreased ability to follow commands.   Transfers Overall transfer level: Needs assistance Equipment used: 1 person hand held assist Transfers: Sit to/from [facility] Sit to Stand: Mod assist Stand pivot transfers: Mod assist       General transfer comment: Mod assist to lift and steady with multimodal cues for problem solving.      Balance Overall balance assessment: Needs assistance Sitting-balance support: Single extremity supported;No upper extremity supported;Feet supported Sitting balance-Leahy Scale: Fair     Standing balance support: Bilateral upper extremity supported;Single extremity supported;During functional activity Standing balance-Leahy Scale: Poor Standing balance comment: Reliant on external assistance.                            ADL either performed or assessed with clinical judgement   ADL Overall ADL's : Needs assistance/impaired Eating/Feeding: Supervision/ safety;Sitting   Grooming: Minimal assistance;Sitting   Upper Body Bathing: Minimal assistance;Sitting   Lower Body Bathing: Moderate assistance;Sit to/from stand   Upper Body Dressing : Minimal assistance;Sitting   Lower Body Dressing: Moderate assistance;Sit to/from stand   Toilet Transfer: Moderate assistance;BSC Toilet Transfer Details (indicate cue type and reason): Utilizing face to face technique. Toileting- Clothing Manipulation and Hygiene: Maximal assistance;Sit to/from stand       Functional mobility during ADLs: Moderate assistance;Maximal assistance (stand-pivot only) General ADL Comments: Pt with decreased problem solving and sequencing skills impacting her ability to safely complete stand-pivot transfer. Highly confused throughout session.      Vision   Vision Assessment?: No apparent visual deficits     Perception     Praxis      Pertinent Vitals/Pain Pain Assessment: No/denies pain     Hand Dominance     Extremity/Trunk Assessment Upper Extremity Assessment Upper Extremity Assessment: Generalized weakness;RUE deficits/detail (grossly 4/5 bilaterally) RUE Deficits / Details: Decreased strength in R shoulder as compared to L with MMT. 4-/5   Lower Extremity Assessment Lower Extremity Assessment: Defer to PT evaluation   Cervical / Trunk Assessment Cervical /  Trunk Assessment:  Kyphotic   Communication Communication Communication: No difficulties   Cognition Arousal/Alertness: Awake/alert Behavior During Therapy: WFL for tasks assessed/performed Overall Cognitive Status: Impaired/Different from baseline Area of Impairment: Orientation;Memory;Attention;Safety/judgement;Following commands;Awareness;Problem solving                 Orientation Level: Disoriented to;Place;Time Current Attention Level: Selective Memory: Decreased short-term memory Following Commands: Follows one step commands inconsistently Safety/Judgement: Decreased awareness of safety;Decreased awareness of deficits Awareness: Intellectual Problem Solving: Slow processing;Difficulty sequencing General Comments: Pt additionally hallucinating that there was blood running down her legs and that there were playing cards in her bed. Pleasantly confused.    General Comments       Exercises     Shoulder Instructions      Home Living Family/patient expects to be discharged to:: Skilled nursing facility Living Arrangements: Alone   Type of Home: Independent living facility Home Access: Level entry     Home Layout: One level     Bathroom Shower/Tub: Occupational psychologist: Standard     Home Equipment: Environmental consultant - 4 wheels          Prior Functioning/Environment Level of Independence: Independent with assistive device(s)        Comments: pt uses rollator for gait, staff does the cleaning, she walks to dining hall for meals        OT Problem List: Decreased strength;Decreased range of motion;Decreased activity tolerance;Impaired balance (sitting and/or standing);Decreased safety awareness;Decreased knowledge of use of DME or AE;Decreased knowledge of precautions;Decreased cognition;Impaired UE functional use;Pain      OT Treatment/Interventions: Self-care/ADL training;Therapeutic exercise;DME and/or AE instruction;Cognitive remediation/compensation;Patient/family  education;Balance training;Energy conservation;Therapeutic activities    OT Goals(Current goals can be found in the care plan section) Acute Rehab OT Goals Patient Stated Goal: sit up in the chair OT Goal Formulation: With patient Time For Goal Achievement: 03/28/17 Potential to Achieve Goals: Good  OT Frequency: Min 2X/week   Barriers to D/C:            Co-evaluation              AM-PAC PT "6 Clicks" Daily Activity     Outcome Measure Help from another person eating meals?: A Little Help from another person taking care of personal grooming?: A Little Help from another person toileting, which includes using toliet, bedpan, or urinal?: A Lot Help from another person bathing (including washing, rinsing, drying)?: A Lot Help from another person to put on and taking off regular upper body clothing?: A Little Help from another person to put on and taking off regular lower body clothing?: A Lot 6 Click Score: 15   End of Session Nurse Communication: Mobility status (Pt hallucinating)  Activity Tolerance: Patient tolerated treatment well Patient left: in chair;with call bell/phone within reach;with chair alarm set  OT Visit Diagnosis: Unsteadiness on feet (R26.81);Muscle weakness (generalized) (M62.81);Pain Pain - Right/Left:  (abdominal) Pain - part of body:  (abdominal)                Time: 1448-1856 OT Time Calculation (min): 24 min Charges:  OT General Charges $OT Visit: 1 Visit OT Evaluation $OT Eval Moderate Complexity: 1 Mod OT Treatments $Self Care/Home Management : 8-22 mins G-Codes:     Norman Herrlich, MS OTR/L  Pager: [place] [facility] Pajak 03/14/2017, 4:11 PM

## 2017-03-14 NOTE — Progress Notes (Signed)
5 Days Post-Op   Subjective/Chief Complaint: Patient awake, no distress Tolerating clears Large ostomy output   Objective: Vital signs in last 24 hours: Temp:  [97.6 F (36.4 C)-98.9 F (37.2 C)] 97.7 F (36.5 C) (09/02 0328) Pulse Rate:  [40-126] 40 (09/02 0328) Resp:  [17-30] 18 (09/02 0328) BP: (92-121)/(54-84) 113/67 (09/02 0328) SpO2:  [93 %-99 %] 97 % (09/02 0328) Weight:  [63.5 kg (140 lb)] 63.5 kg (140 lb) (09/02 0328) Last BM Date: 03/13/17  Intake/Output from previous day: 09/01 0701 - 09/02 0700 In: 2640.6 [P.O.:380; I.V.:2260.6] Out: 2520 [Urine:450; Emesis/NG output:650; FBXUX:8333] Intake/Output this shift: No intake/output data recorded.   General appearance: cooperative GI: soft, stoma pink with liquid stool output, dressing with dry stain, +BS Minimal incisional tenderness  Lab Results:   Recent Labs  03/13/17 0508 03/14/17 0451  WBC 14.5* 11.9*  HGB 14.7 15.0  HCT 46.0 44.4  PLT 212 178   BMET  Recent Labs  03/13/17 1048 03/14/17 0451  NA 142 145  K 2.7* 3.2*  CL 117* 119*  CO2 20* 16*  GLUCOSE 145* 122*  BUN 30* 30*  CREATININE 1.19* 0.98  CALCIUM 8.7* 8.7*   PT/INR No results for input(s): LABPROT, INR in the last 72 hours. ABG No results for input(s): PHART, HCO3 in the last 72 hours.  Invalid input(s): PCO2, PO2  Studies/Results: No results found.  Anti-infectives: Anti-infectives    Start     Dose/Rate Route Frequency Ordered Stop   03/09/17 1230  cefoTEtan (CEFOTAN) 2 g in dextrose 5 % 50 mL IVPB  Status:  Discontinued     2 g 100 mL/hr over 30 Minutes Intravenous Every 12 hours 03/09/17 1122 03/09/17 1844      Assessment/Plan: s/p Procedure(s): EXPLORATORY LAPAROTOMY WITH FROZEN SECTION (N/A) LOOP COLOSTOMY High grade rectosigmoid obstruction Obstructive colon mass,with metastasis to the liver and peritoneium S/p exploratory laparotomy with diverting colostomy, 03/09/17,Dr. Judeth Horn - full  liquids Hypokalemia - improved; per primary team   LOS: 7 days    Tomothy Eddins K. 03/14/2017

## 2017-03-14 NOTE — Consult Note (Signed)
Cardiology Consultation:   Patient ID: [name]; [id number]; [birth year]   Admit date: 03/07/2017 Date of Consult: 03/14/2017  Primary Care Provider: Antony Contras, MD Primary Cardiologist: Dr Gay Filler   Patient Profile:   [name] is a 83 y.o. female with a hx of SBO who is being seen today for the evaluation of AF at the request of Dr Karleen Hampshire.  History of Present Illness:   [name] is a 83 y/o female with no history of CAD, MI, or AF- admitted 03/08/17 with bowel obstruction. She had exploratory lap and colostomy 03/09/17. Pathology revealed adeno CA. She has been recovering slowly. On 03/12/17 she went into AF with RVR. Initially she responded to IV metoprolol, and converted to sinus, she went back into afib and required cardizem gtt. She was transfered to step down.  Her rate is controlled with IV Diltiazem. She was unaware of AF, (though she does not appear to be completley oriented this am). An echo was done which showed normal LVF and normal LA size.    Past Medical History:  Diagnosis Date  . Anxiety   . Arthritis   . Chronic kidney disease   . Constipation   . Movement disorder   . Sinus drainage   . Stroke [facility])    hx of ministroke in 2011   . Vision abnormalities     Past Surgical History:  Procedure Laterality Date  . COLECTOMY WITH COLOSTOMY CREATION/HARTMANN PROCEDURE N/A 03/09/2017   Procedure: EXPLORATORY LAPAROTOMY WITH FROZEN SECTION;  Surgeon: Judeth Horn, MD;  Location: [place];  Service: General;  Laterality: N/A;  . COLOSTOMY  03/09/2017   Procedure: LOOP COLOSTOMY;  Surgeon: Judeth Horn, MD;  Location: [place];  Service: General;;  . EYE SURGERY     bilateral cataract surgery   . FLEXIBLE SIGMOIDOSCOPY N/A 03/08/2017   Procedure: FLEXIBLE SIGMOIDOSCOPY;  Surgeon: Otis Brace, MD;  Location: [place];  Service: Gastroenterology;  Laterality: N/A;  . JOINT REPLACEMENT     right hip   . TOTAL HIP ARTHROPLASTY  03/08/2012   Procedure: TOTAL  HIP ARTHROPLASTY ANTERIOR APPROACH;  Surgeon: Mauri Pole, MD;  Location: [facility];  Service: Orthopedics;  Laterality: Left;  . tumor removed right breast      benign      Home Medications:  Prior to Admission medications   Medication Sig Start Date End Date Taking? Authorizing Provider  ALPRAZolam Duanne Moron) 0.5 MG tablet Take 0.25 mg by mouth at bedtime as needed for sleep. Anxiety   Yes [provider]  aspirin 81 MG chewable tablet Chew 81 mg by mouth daily.   Yes [provider]  Cholecalciferol (VITAMIN D) 2000 UNITS tablet Take 2,000 Units by mouth every morning.   Yes [provider]  diphenhydrAMINE (BENADRYL) 25 mg capsule Take 1 capsule (25 mg total) by mouth every 6 (six) hours as needed for itching, allergies or sleep. Patient not taking: Reported on 03/04/2017 03/10/12 03/20/12  Danae Orleans, PA-C  ferrous sulfate 325 (65 FE) MG tablet Take 1 tablet (325 mg total) by mouth 3 (three) times daily after meals. Patient not taking: Reported on 03/04/2017 03/10/12 03/10/13  Danae Orleans, PA-C    Inpatient Medications: Scheduled Meds: . acetaminophen (TYLENOL) oral liquid 160 mg/5 mL  650 mg Oral Q6H  . enoxaparin (LOVENOX) injection  30 mg Subcutaneous Q24H  . famotidine  20 mg Oral Daily  . feeding supplement (ENSURE ENLIVE)  237 mL Oral BID BM  . mouth rinse  15 mL Mouth Rinse BID  . metoprolol tartrate  12.5 mg Oral BID  . potassium chloride  20 mEq Oral Once  . potassium chloride  40 mEq Oral BID   Continuous Infusions: . sodium chloride    . sodium chloride 100 mL/hr at 03/13/17 0605  . diltiazem (CARDIZEM) infusion 5 mg/hr (03/14/17 0006)  . lactated ringers 10 mL/hr at 03/09/17 1311  . sodium chloride     PRN Meds: codeine, HYDROmorphone (DILAUDID) injection, metoprolol tartrate, ondansetron **OR** ondansetron (ZOFRAN) IV  Allergies:   No Known Allergies  Social History:   Social History   Social History  . Marital status: Widowed     Spouse name: N/A  . Number of children: N/A  . Years of education: N/A   Occupational History  . Not on file.   Social History Main Topics  . Smoking status: Never Smoker  . Smokeless tobacco: Never Used  . Alcohol use Not on file  . Drug use: No  . Sexual activity: Not on file   Other Topics Concern  . Not on file   Social History Narrative  . No narrative on file    Family History:    Family History  Problem Relation Age of Onset  . Heart disease Mother   . Dementia Mother   . COPD Father      ROS:  Please see the history of present illness.  ROS  All other ROS reviewed and negative.     Physical Exam/Data:   Vitals:   03/13/17 1831 03/13/17 1930 03/13/17 2254 03/14/17 0328  BP:  95/66 108/68 113/67  Pulse:  (!) 101 77 (!) 40  Resp:  19  18  Temp: 98 F (36.7 C) 98.9 F (37.2 C) 97.6 F (36.4 C) 97.7 F (36.5 C)  TempSrc:  Oral Oral Oral  SpO2:  98% 93% 97%  Weight:    140 lb (63.5 kg)  Height:        Intake/Output Summary (Last 24 hours) at 03/14/17 0824 Last data filed at 03/14/17 0700  Gross per 24 hour  Intake          2640.58 ml  Output             2520 ml  Net           120.58 ml   Filed Weights   03/11/17 2100 03/12/17 1900 03/14/17 0328  Weight: 131 lb 13.4 oz (59.8 kg) 138 lb (62.6 kg) 140 lb (63.5 kg)   Body mass index is 24.8 kg/m.  General:  Elderly frail female, NAD HEENT: normal Lymph: no adenopathy Neck: no JVD Endocrine:  No thryomegaly Vascular: No carotid bruits; FA pulses 2+ bilaterally without bruits  Cardiac:  Irregularly irregular Lungs:  Decreased breath sounds Abd: soft, midline surgical scar, colostomy bag Ext: no edema Musculoskeletal:  No deformities, BUE and BLE strength normal and equal Skin: warm and dry  Neuro:  CNs 2-12 intact, no focal abnormalities noted Psych:  Normal affect   EKG:  The EKG 03/12/17- was personally reviewed and demonstrates:  AF with VR 107 Telemetry:  Telemetry was personally  reviewed and demonstrates:  AF with CVR  Laboratory Data:  Chemistry Recent Labs Lab 03/13/17 0508 03/13/17 1048 03/14/17 0451  NA 144 142 145  K 3.5 2.7* 3.2*  CL 119* 117* 119*  CO2 14* 20* 16*  GLUCOSE 119* 145* 122*  BUN 32* 30* 30*  CREATININE 1.08* 1.19* 0.98  CALCIUM 8.8* 8.7* 8.7*  GFRNONAA 44* 39* 49*  GFRAA 50* 45* 57*  ANIONGAP 11 5 10      Recent Labs Lab 03/13/17 0508  PROT 4.8*  ALBUMIN 1.9*  AST 84*  ALT 126*  ALKPHOS 138*  BILITOT 0.9   Hematology Recent Labs Lab 03/11/17 1012 03/13/17 0508 03/14/17 0451  WBC 6.4 14.5* 11.9*  RBC 4.72 5.11 5.09  HGB 13.6 14.7 15.0  HCT 43.1 46.0 44.4  MCV 91.3 90.0 87.2  MCH 28.8 28.8 29.5  MCHC 31.6 32.0 33.8  RDW 15.1 15.3 15.2  PLT 199 212 178   Cardiac Enzymes Recent Labs Lab 03/12/17 0040 03/12/17 0524 03/12/17 1303 03/13/17 1329  TROPONINI <0.03 0.03* 0.05* 0.57*   No results for input(s): TROPIPOC in the last 168 hours.  BNPNo results for input(s): BNP, PROBNP in the last 168 hours.  DDimer No results for input(s): DDIMER in the last 168 hours.  Radiology/Studies:  No results found.   Echo 03/12/17- Study Conclusions  - Left ventricle: The cavity size was normal. Wall thickness was   normal. Systolic function was vigorous. The estimated ejection   fraction was in the range of 65% to 70%. - Pulmonary arteries: PA peak pressure: 35 mm Hg (S).  Assessment and Plan:   AF- New onset-rate controlled with IV Diltiazem  Bowel obstruction-  S/p exploratory lap/ colostomy  H/O CVA- Lacunar infarct on MRI 4/16  Colon CA- Pt in DNR  Plan: K+ ordered. She should be anticoagulated if OK with surgery. CHADs VASDc=5 for age, sex, h/o CVA. Continue IV Diltiazem for now, it's not clear she is tolerating PO yet. MD to see.   Angelena Form, PA-C  03/14/2017 8:24 AM

## 2017-03-14 NOTE — Progress Notes (Signed)
Patients input 83ml with out 1,470 bolus of 331 given to patient per written order.

## 2017-03-15 DIAGNOSIS — L8990 Pressure ulcer of unspecified site, unspecified stage: Secondary | ICD-10-CM | POA: Insufficient documentation

## 2017-03-15 LAB — BASIC METABOLIC PANEL
Anion gap: 8 (ref 5–15)
BUN: 28 mg/dL — AB (ref 6–20)
CO2: 14 mmol/L — AB (ref 22–32)
CREATININE: 0.92 mg/dL (ref 0.44–1.00)
Calcium: 8.4 mg/dL — ABNORMAL LOW (ref 8.9–10.3)
Chloride: 121 mmol/L — ABNORMAL HIGH (ref 101–111)
GFR calc non Af Amer: 53 mL/min — ABNORMAL LOW (ref >60)
GLUCOSE: 120 mg/dL — AB (ref 65–99)
Potassium: 3.3 mmol/L — ABNORMAL LOW (ref 3.5–5.1)
Sodium: 143 mmol/L (ref 135–145)

## 2017-03-15 LAB — GLUCOSE, CAPILLARY
GLUCOSE-CAPILLARY: 111 mg/dL — AB (ref 65–99)
GLUCOSE-CAPILLARY: 102 mg/dL — AB (ref 65–99)
Glucose-Capillary: 112 mg/dL — ABNORMAL HIGH (ref 65–99)

## 2017-03-15 MED ORDER — POTASSIUM CHLORIDE CRYS ER 20 MEQ PO TBCR
40.0000 meq | EXTENDED_RELEASE_TABLET | Freq: Two times a day (BID) | ORAL | Status: AC
  Administered 2017-03-15: 40 meq via ORAL
  Filled 2017-03-15 (×2): qty 2

## 2017-03-15 MED ORDER — APIXABAN 5 MG PO TABS
5.0000 mg | ORAL_TABLET | Freq: Two times a day (BID) | ORAL | Status: DC
  Administered 2017-03-15 – 2017-03-16 (×3): 5 mg via ORAL
  Filled 2017-03-15 (×3): qty 1

## 2017-03-15 NOTE — Progress Notes (Signed)
Patient arrived to [phone] at 23.  A&Ox1.  IV clean, dry, intact, and saline locked.  No family present.  VS stable with exception of slightly low BP.  Will continue to monitor.  Abdomen noted with midline incision closed with staples and established colostomy.  Multiple areas on skin of redness.  Patient in mild pain.  No family present.  Placed on cardiac monitor and applied Purewick external catheter.

## 2017-03-15 NOTE — Progress Notes (Signed)
Progress Note  Patient Name: [name] Date of Encounter: 03/15/2017  Primary Cardiologist: [place]  Subjective   Sleeps comfortably, fully supine. No CV complaints. Rate control is good. Off IV diltiazem since 1315h yesterday.  Inpatient Medications    Scheduled Meds: . acetaminophen (TYLENOL) oral liquid 160 mg/5 mL  650 mg Oral Q6H  . diltiazem  120 mg Oral Daily  . enoxaparin (LOVENOX) injection  30 mg Subcutaneous Q24H  . famotidine  20 mg Oral Daily  . feeding supplement (ENSURE ENLIVE)  237 mL Oral BID BM  . mouth rinse  15 mL Mouth Rinse BID  . metoprolol tartrate  12.5 mg Oral BID   Continuous Infusions: . sodium chloride    . sodium chloride 100 mL/hr at 03/13/17 0605  . lactated ringers 10 mL/hr at 03/09/17 1311  . sodium chloride     PRN Meds: codeine, HYDROmorphone (DILAUDID) injection, metoprolol tartrate, ondansetron **OR** ondansetron (ZOFRAN) IV   Vital Signs    Vitals:   03/14/17 2143 03/14/17 2333 03/15/17 0355 03/15/17 0532  BP: 125/79 117/73 99/63   Pulse: (!) 110 99 77   Resp:  19 15   Temp:  98.4 F (36.9 C) (!) 97.5 F (36.4 C)   TempSrc:  Oral Oral   SpO2:  99% 99%   Weight:    149 lb (67.6 kg)  Height:        Intake/Output Summary (Last 24 hours) at 03/15/17 0806 Last data filed at 03/15/17 0533  Gross per 24 hour  Intake          3756.25 ml  Output              775 ml  Net          2981.25 ml   Filed Weights   03/12/17 1900 03/14/17 0328 03/15/17 0532  Weight: 138 lb (62.6 kg) 140 lb (63.5 kg) 149 lb (67.6 kg)    Telemetry    AF with controlled rate. - Personally Reviewed  ECG    8/29 - NSR 8/31 - AFib w RVR and limb lead reversal  - Personally Reviewed  Physical Exam  Calm, relaxed GEN: No acute distress.   Neck: No JVD Cardiac: irregular, no murmurs, rubs, or gallops.  Respiratory: Clear to auscultation bilaterally. GI: Soft, nontender, non-distended  MS: No edema; No deformity. Neuro:  Nonfocal  Psych:  Normal affect   Labs    Chemistry Recent Labs Lab 03/13/17 0508 03/13/17 1048 03/14/17 0451 03/14/17 1259 03/15/17 0347  NA 144 142 145  --  143  K 3.5 2.7* 3.2*  --  3.3*  CL 119* 117* 119*  --  121*  CO2 14* 20* 16*  --  14*  GLUCOSE 119* 145* 122*  --  120*  BUN 32* 30* 30*  --  28*  CREATININE 1.08* 1.19* 0.98  --  0.92  CALCIUM 8.8* 8.7* 8.7*  --  8.4*  PROT 4.8*  --   --  4.8*  --   ALBUMIN 1.9*  --   --  1.7*  --   AST 84*  --   --  98*  --   ALT 126*  --   --  157*  --   ALKPHOS 138*  --   --  121  --   BILITOT 0.9  --   --  0.9  --   GFRNONAA 44* 39* 49*  --  53*  GFRAA 50* 45* 57*  --  >60  ANIONGAP 11 5 10   --  8     Hematology Recent Labs Lab 03/11/17 1012 03/13/17 0508 03/14/17 0451  WBC 6.4 14.5* 11.9*  RBC 4.72 5.11 5.09  HGB 13.6 14.7 15.0  HCT 43.1 46.0 44.4  MCV 91.3 90.0 87.2  MCH 28.8 28.8 29.5  MCHC 31.6 32.0 33.8  RDW 15.1 15.3 15.2  PLT 199 212 178    Cardiac Enzymes Recent Labs Lab 03/12/17 0040 03/12/17 0524 03/12/17 1303 03/13/17 1329  TROPONINI <0.03 0.03* 0.05* 0.57*   No results for input(s): TROPIPOC in the last 168 hours.   BNPNo results for input(s): BNP, PROBNP in the last 168 hours.   DDimer No results for input(s): DDIMER in the last 168 hours.   Radiology    Dg Chest Port 1 View  Result Date: 03/14/2017 CLINICAL DATA:  Increased cough. EXAM: PORTABLE CHEST 1 VIEW COMPARISON:  03/07/2017. FINDINGS: Normal sized heart. Interval dense left lower lobe airspace opacity. Poor inspiration with minimal bibasilar atelectasis. Diffuse osteopenia. IMPRESSION: 1. Dense left lower lobe atelectasis or pneumonia. 2. Poor inspiration with minimal bibasilar atelectasis. Electronically Signed   By: Claudie Revering M.D.   On: 03/14/2017 13:36    Cardiac Studies   ECHO 03/12/2017 - Left ventricle: The cavity size was normal. Wall thickness was   normal. Systolic function was vigorous. The estimated ejection   fraction was in the  range of 65% to 70%. - Pulmonary arteries: PA peak pressure: 35 mm Hg (S).  Patient Profile     83 y.o. female with new onset atrial fibrillation and history of colostomy for colon Ca.  Assessment & Plan    1. AFib: rate controlled on low dose PO metoprolol. CHADSVasc 5 (age 59, CVA in 2016 2, gender), currently not on anticoagulation. Continue metoprolol - the dose may need to be increased if she becomes more active. Risk of stroke is high, but life expectancy is probably limited. If survival is expected to be > 12 months, strongly recommend anticoagulation when this is felt to be safe from surgical point of view - Eliquis 5 mg PO BID. 2. Colon Ca s/p colostomy for obstruction, with evidence for peritoneal and liver metastases.  No other recommendations. Please call back if there are additional concerns.  Signed, Sanda Klein, MD  03/15/2017, 8:06 AM

## 2017-03-15 NOTE — Progress Notes (Signed)
When RN was putting patient back to bed, RN checked ostomy and noted that ostomy dressing was leaking into midline honeycomb surgical dressing. RN removed honeycomb dressing and cleaned abdominal incision. Ostomy dressing changed. Grandville Silos, MD paged about surgical dressing being removed. Stated to leave abdominal incision OTA. RN will continue to monitor site.

## 2017-03-15 NOTE — Progress Notes (Signed)
,  PROGRESS NOTE    [name]  HWE:[id number] DOB: 1924/03/24 DOA: 03/07/2017 PCP: Antony Contras, MD   Brief Narrative: [name] a 83 y.o.femalewith history of TIA presents to the ER because of persistent abdominal pain with nausea vomiting. She was found to have high grade rectosigmoid obstruction with a colon mass with mets to liver and peritoneum. She underwent exp lap with diverting colostomy on 8/28.  Currently she is doing better, surgery recommended advancing his diet.   Assessment & Plan:   Principal Problem:   Large bowel obstruction (HCC) Active Problems:   History of CVA in adulthood   Bowel obstruction (HCC)   Atrial fibrillation with RVR (HCC)   Pressure injury of skin   Colon mass:  causing rectosigmoid obstruction leading to symptoms.  Underwent exp lap with diverting colostomy.  Pain control and symptom management.  Surgery on board and appreciate their recommendations.  Started on clears last night and advancing diet as per surgery recommendations.  Biopsy from the colon mass came back negative for malignancy.  Colostomy bag is leaking today. woc consulted.     AKI: with stage 3 CKD: suspect from dehydration and decreased po intake.  On IV fluids with normal saline.  Repeat bmp ordered and shows improvement. Stop the IV fluids. Today.     Hypokalemia: replete as needed. Keep it greater than 4. And magnesium greater than 2.   H/o TIA:  On aspirin at home, which is on hold for now.  Since she will be started on eliquis , will hold the aspirin.    New onset a fib overnight, paroxysmal in nature:  Initially responded to IV metoprolol, and converted to sinus, she went back into afib and required cardizem gtt, transitioned to oral cardizem.  Transferred to step down.  Echocardiogram ordered showed grade 1 diastolic dysfunction, and troponin elevated at 0.57.  TSh wnl.  Suspect brought on by dehydration and electrolyte abnormalities. Since her  Mali 2 VASC is around 5, discussed with daughter on 9/1 about anticoagulation. She agreed to go ahead with anti coagulation, but her mom today did not want to be started on , as per conversations with cardiology.  Started her on eliquis.    Leukocytosis:  Unclear etiology, cxr left lower atelectasis. Afebrile. Leukocytosis improving.     Elevated liver function test:  Unclear etiology. She reports some mild abdominal discomfort from the surgery.  Repeat liver function tests in am show improvement.   DVT prophylaxis: lovenox.  Code Status: DNR Family Communication: NONE at bedside.  Disposition Plan: SNF tomorrow.   Consultants:   Surgery.   Cardiology     Procedures: exp lap   Antimicrobials: pre op antibiotics.    Subjective: Right arm swollen.  From IV leakage. No tenderness.   Objective: Vitals:   03/15/17 0731 03/15/17 1010 03/15/17 1157 03/15/17 1517  BP: (!) 104/45 95/60 (!) 95/55 (!) 89/57  Pulse: 85  88 91  Resp: 19  (!) 22 (!) 23  Temp: 97.7 F (36.5 C)  98.1 F (36.7 C) 98.1 F (36.7 C)  TempSrc: Axillary  Axillary Axillary  SpO2: 98%  99% 94%  Weight:      Height:        Intake/Output Summary (Last 24 hours) at 03/15/17 1731 Last data filed at 03/15/17 1335  Gross per 24 hour  Intake             1698 ml  Output  975 ml  Net              723 ml   Filed Weights   03/12/17 1900 03/14/17 0328 03/15/17 0532  Weight: 62.6 kg (138 lb) 63.5 kg (140 lb) 67.6 kg (149 lb)    Examination:  General exam: Appears calm and comfortable not in any distress.  Respiratory system: clear , air entry fair.  Cardiovascular system: S1 & S2 heard, RRR. No JVD, murmurs, rubs, gallops or clicks. 1+ LEG edema.  Gastrointestinal system: soft non tender, colostomy bag with liquid stool. Non distended. Central nervous system: Alert and oriented to place and person. No focal neurological deficits. Extremities: Symmetric 5 x 5 power. 1+ leg edema, and  right arm swollen from IV leak.  Skin: No rashes, lesions or ulcers Psychiatry:  Mood & affect appropriate.     Data Reviewed: I have personally reviewed following labs and imaging studies  CBC:  Recent Labs Lab 03/09/17 0258 03/10/17 0606 03/11/17 1012 03/13/17 0508 03/14/17 0451  WBC 3.6* 4.1 6.4 14.5* 11.9*  NEUTROABS  --   --  5.2  --   --   HGB 12.4 12.6 13.6 14.7 15.0  HCT 38.1 40.2 43.1 46.0 44.4  MCV 90.5 91.2 91.3 90.0 87.2  PLT 233 200 199 212 182   Basic Metabolic Panel:  Recent Labs Lab 03/10/17 0606  03/12/17 1303 03/13/17 0508 03/13/17 1048 03/13/17 1329 03/14/17 0451 03/14/17 1259 03/14/17 1447 03/15/17 0347  NA 143  < > 140 144 142  --  145  --   --  143  K 4.2  < > 3.3* 3.5 2.7*  --  3.2*  --   --  3.3*  CL 112*  < > 115* 119* 117*  --  119*  --   --  121*  CO2 25  < > 19* 14* 20*  --  16*  --   --  14*  GLUCOSE 193*  < > 131* 119* 145*  --  122*  --   --  120*  BUN 29*  < > 32* 32* 30*  --  30*  --   --  28*  CREATININE 1.04*  < > 1.27* 1.08* 1.19*  --  0.98  --   --  0.92  CALCIUM 8.3*  < > 8.1* 8.8* 8.7*  --  8.7*  --   --  8.4*  MG 2.1  --  1.8  --   --  1.7  --  QUANTITY NOT SUFFICIENT, UNABLE TO PERFORM TEST 2.2  --   PHOS  --   --  2.8  --   --   --   --   --   --   --   < > = values in this interval not displayed. GFR: Estimated Creatinine Clearance: 36.8 mL/min (by C-G formula based on SCr of 0.92 mg/dL). Liver Function Tests:  Recent Labs Lab 03/13/17 0508 03/14/17 1259  AST 84* 98*  ALT 126* 157*  ALKPHOS 138* 121  BILITOT 0.9 0.9  PROT 4.8* 4.8*  ALBUMIN 1.9* 1.7*   No results for input(s): LIPASE, AMYLASE in the last 168 hours. No results for input(s): AMMONIA in the last 168 hours. Coagulation Profile: No results for input(s): INR, PROTIME in the last 168 hours. Cardiac Enzymes:  Recent Labs Lab 03/12/17 0040 03/12/17 0524 03/12/17 1303 03/13/17 1329  TROPONINI <0.03 0.03* 0.05* 0.57*   BNP (last 3  results) No results for input(s): PROBNP in the  last 8760 hours. HbA1C: No results for input(s): HGBA1C in the last 72 hours. CBG:  Recent Labs Lab 03/14/17 1238 03/14/17 2120 03/15/17 0356 03/15/17 1200 03/15/17 1553  GLUCAP 131* 103* 112* 111* 102*   Lipid Profile: No results for input(s): CHOL, HDL, LDLCALC, TRIG, CHOLHDL, LDLDIRECT in the last 72 hours. Thyroid Function Tests: No results for input(s): TSH, T4TOTAL, FREET4, T3FREE, THYROIDAB in the last 72 hours. Anemia Panel: No results for input(s): VITAMINB12, FOLATE, FERRITIN, TIBC, IRON, RETICCTPCT in the last 72 hours. Sepsis Labs: No results for input(s): PROCALCITON, LATICACIDVEN in the last 168 hours.  Recent Results (from the past 240 hour(s))  MRSA PCR Screening     Status: None   Collection Time: 03/09/17 11:52 AM  Result Value Ref Range Status   MRSA by PCR NEGATIVE NEGATIVE Final    Comment:        The GeneXpert MRSA Assay (FDA approved for NASAL specimens only), is one component of a comprehensive MRSA colonization surveillance program. It is not intended to diagnose MRSA infection nor to guide or monitor treatment for MRSA infections.          Radiology Studies: Dg Chest Port 1 View  Result Date: 03/14/2017 CLINICAL DATA:  Increased cough. EXAM: PORTABLE CHEST 1 VIEW COMPARISON:  03/07/2017. FINDINGS: Normal sized heart. Interval dense left lower lobe airspace opacity. Poor inspiration with minimal bibasilar atelectasis. Diffuse osteopenia. IMPRESSION: 1. Dense left lower lobe atelectasis or pneumonia. 2. Poor inspiration with minimal bibasilar atelectasis. Electronically Signed   By: Claudie Revering M.D.   On: 03/14/2017 13:36        Scheduled Meds: . acetaminophen (TYLENOL) oral liquid 160 mg/5 mL  650 mg Oral Q6H  . apixaban  5 mg Oral BID  . diltiazem  120 mg Oral Daily  . famotidine  20 mg Oral Daily  . feeding supplement (ENSURE ENLIVE)  237 mL Oral BID BM  . mouth rinse  15 mL Mouth  Rinse BID  . metoprolol tartrate  12.5 mg Oral BID  . potassium chloride  40 mEq Oral BID   Continuous Infusions: . sodium chloride    . lactated ringers 10 mL/hr at 03/09/17 1311  . sodium chloride       LOS: 8 days    Time spent: 35 minutes.     Hosie Poisson, MD [facility] Pager [phone]  If 7PM-7AM, please contact night-coverage [email or web address] Password [facility] 03/15/2017, 5:31 PM

## 2017-03-15 NOTE — Progress Notes (Signed)
Physical Therapy Treatment Patient Details Name: [name] MRN: [id number] DOB: [birth year] Today's Date: 03/15/2017    History of Present Illness 83 y.o. female with history of TIA, arthritis and movement disorder admitted with high grade rectosigmoid obstruction with a colon mass with mets to liver and peritoneum. She underwent exp lap with diverting colostomy on 8/28. Biopsy showed adeno carcinoma.    PT Comments    Pt ambulated 15' with min A with RW, very fatigued after this distance and needed to sit. Pleasantly confused at times during session and making nonsensical conversation. PT will continue to follow.    Follow Up Recommendations  SNF;Supervision for mobility/OOB     Equipment Recommendations  None recommended by PT    Recommendations for Other Services       Precautions / Restrictions Precautions Precautions: Fall Restrictions Weight Bearing Restrictions: No Other Position/Activity Restrictions: colostomy    Mobility  Bed Mobility Overal bed mobility: Needs Assistance Bed Mobility: Rolling;Sidelying to Sit Rolling: Mod assist Sidelying to sit: Mod assist       General bed mobility comments: pt rolled to each side to get pad under her before getting up (due to stress incontinence). Mod A for LE's off bed and elevation of trunk  Transfers Overall transfer level: Needs assistance Equipment used: Rolling walker (2 wheeled) Transfers: Sit to/from Stand Sit to Stand: Mod assist         General transfer comment: Mod assist to lift and steady with multimodal cues for problem solving.   Ambulation/Gait Ambulation/Gait assistance: Min assist Ambulation Distance (Feet): 15 Feet Assistive device: Rolling walker (2 wheeled) Gait Pattern/deviations: Step-to pattern;Trunk flexed;Decreased weight shift to right;Decreased step length - right Gait velocity: very slow Gait velocity interpretation: <1.8 ft/sec, indicative of risk for recurrent falls General Gait  Details: pt keeps RLE behind her and hip externally rotated, frequent cues for stepping R foot. Frequent vc's for upright posture. Pt had difficulty ambulating around foot of bed to chair on other side   Stairs            Wheelchair Mobility    Modified Rankin (Stroke Patients Only)       Balance Overall balance assessment: Needs assistance Sitting-balance support: No upper extremity supported Sitting balance-Leahy Scale: Fair     Standing balance support: Bilateral upper extremity supported;During functional activity Standing balance-Leahy Scale: Poor Standing balance comment: Reliant on external assistance.                             Cognition Arousal/Alertness: Awake/alert Behavior During Therapy: WFL for tasks assessed/performed Overall Cognitive Status: Impaired/Different from baseline Area of Impairment: Orientation;Memory;Attention;Safety/judgement;Following commands;Awareness;Problem solving                 Orientation Level: Disoriented to;Time;Situation Current Attention Level: Selective Memory: Decreased short-term memory Following Commands: Follows one step commands consistently;Follows multi-step commands inconsistently Safety/Judgement: Decreased awareness of safety;Decreased awareness of deficits Awareness: Intellectual Problem Solving: Slow processing;Difficulty sequencing General Comments: pt pleasantly confused at times thoughout session      Exercises      General Comments General comments (skin integrity, edema, etc.): pt glad to be sitting in chair after session      Pertinent Vitals/Pain Pain Assessment: No/denies pain    Home Living                      Prior Function  PT Goals (current goals can now be found in the care plan section) Acute Rehab PT Goals Patient Stated Goal: not stated PT Goal Formulation: With patient Time For Goal Achievement: 03/25/17 Potential to Achieve Goals:  Good Progress towards PT goals: Progressing toward goals    Frequency    Min 3X/week      PT Plan Current plan remains appropriate    Co-evaluation              AM-PAC PT "6 Clicks" Daily Activity  Outcome Measure  Difficulty turning over in bed (including adjusting bedclothes, sheets and blankets)?: Unable Difficulty moving from lying on back to sitting on the side of the bed? : Unable Difficulty sitting down on and standing up from a chair with arms (e.g., wheelchair, bedside commode, etc,.)?: Unable Help needed moving to and from a bed to chair (including a wheelchair)?: A Lot Help needed walking in hospital room?: A Lot Help needed climbing 3-5 steps with a railing? : A Lot 6 Click Score: 9    End of Session Equipment Utilized During Treatment: Gait belt Activity Tolerance: Patient tolerated treatment well Patient left: in chair;with call bell/phone within reach;with chair alarm set Nurse Communication: Mobility status PT Visit Diagnosis: Other abnormalities of gait and mobility (R26.89);Difficulty in walking, not elsewhere classified (R26.2)     Time: 6967-8938 PT Time Calculation (min) (ACUTE ONLY): 32 min  Charges:  $Gait Training: 8-22 mins $Therapeutic Activity: 8-22 mins                    G Codes:       Leighton Roach, PT  Acute Rehab Services  [place] 03/15/2017, 11:53 AM

## 2017-03-15 NOTE — Consult Note (Signed)
[place] Nurse wound follow up Stoma type/location: LLQ Colostomy, pouch change performed today with assistance.  Discharge plan is to SNF.  Patient able to open and close today as well as empty with verbal cueing.  She is much more oriented today than my last session.   Stomal assessment/size: 1 3/4" 1 piece flat pouch  Peristomal assessment: Some erythema and medical adhesive related skin injury at 7 o'clock.  Another piece of cut barrier is placed here to protect.  Treatment options for stomal/peristomal skin: Barrier ring (But would need to be flattened significantly- I used the trimmed barrier ) and 1 piece flat pouch.  Once rod is removed, a convex pouch would be ideal, but would likely leak at this time. Will order 1 flat and 1 convex for next pouch change.  Output Liquid brown stool Ostomy pouching: 1pc. Flat with cut barrier to serve as a flat ring. Education provided: Patient participates in care today.   Understands she will be discharged to rehab for assistance and is amenable to this.  Enrolled patient in [place] Start Discharge program: Yes [place] team will follow.  Domenic Moras RN BSN [place] Pager [phone]

## 2017-03-15 NOTE — Progress Notes (Signed)
6 Days Post-Op   Subjective/Chief Complaint: Resting comfortably Tolerating full liquids Mildly confused   Objective: Vital signs in last 24 hours: Temp:  [97.5 F (36.4 C)-99.1 F (37.3 C)] 97.7 F (36.5 C) (09/03 0731) Pulse Rate:  [77-110] 85 (09/03 0731) Resp:  [15-20] 19 (09/03 0731) BP: (99-125)/(45-87) 104/45 (09/03 0731) SpO2:  [98 %-100 %] 98 % (09/03 0731) Weight:  [67.6 kg (149 lb)] 67.6 kg (149 lb) (09/03 0532) Last BM Date: 03/15/17  Intake/Output from previous day: 09/02 0701 - 09/03 0700 In: 3756.3 [P.O.:958; I.V.:2798.3] Out: 775 [Urine:300; Stool:475] Intake/Output this shift: Total I/O In: 120 [P.O.:120] Out: 125 [Urine:125]  General appearance: cooperative, mildly disoriented; not agitated GI: soft, stoma pink with thicker stool output, dressing with dry stain - edge of dressing removed with ostomy appliance change, +BS Minimal incisional tenderness Lab Results:   Recent Labs  03/13/17 0508 03/14/17 0451  WBC 14.5* 11.9*  HGB 14.7 15.0  HCT 46.0 44.4  PLT 212 178   BMET  Recent Labs  03/14/17 0451 03/15/17 0347  NA 145 143  K 3.2* 3.3*  CL 119* 121*  CO2 16* 14*  GLUCOSE 122* 120*  BUN 30* 28*  CREATININE 0.98 0.92  CALCIUM 8.7* 8.4*   PT/INR No results for input(s): LABPROT, INR in the last 72 hours. ABG No results for input(s): PHART, HCO3 in the last 72 hours.  Invalid input(s): PCO2, PO2  Studies/Results: Dg Chest Port 1 View  Result Date: 03/14/2017 CLINICAL DATA:  Increased cough. EXAM: PORTABLE CHEST 1 VIEW COMPARISON:  03/07/2017. FINDINGS: Normal sized heart. Interval dense left lower lobe airspace opacity. Poor inspiration with minimal bibasilar atelectasis. Diffuse osteopenia. IMPRESSION: 1. Dense left lower lobe atelectasis or pneumonia. 2. Poor inspiration with minimal bibasilar atelectasis. Electronically Signed   By: Claudie Revering M.D.   On: 03/14/2017 13:36    Anti-infectives: Anti-infectives    Start      Dose/Rate Route Frequency Ordered Stop   03/09/17 1230  cefoTEtan (CEFOTAN) 2 g in dextrose 5 % 50 mL IVPB  Status:  Discontinued     2 g 100 mL/hr over 30 Minutes Intravenous Every 12 hours 03/09/17 1122 03/09/17 1844      Assessment/Plan: s/p Procedure(s): EXPLORATORY LAPAROTOMY WITH FROZEN SECTION (N/A) LOOP COLOSTOMY High grade rectosigmoid obstruction Obstructive colon mass,with metastasis to the liver and peritoneium S/p exploratory laparotomy with diverting colostomy, 03/09/17 - Dr. Judeth Horn - full liquids, advance as tolerated Hypokalemia - improved; per primary team  LOS: 8 days    [name] K. 03/15/2017

## 2017-03-15 NOTE — Progress Notes (Signed)
Increased redness to R elbow area. The area is warm to touch and painful for pt to move. There is a fluid filled blister to anterior elbow. [place] area marked and extremity was elevated. TRD NP was notified. Will continue monitor.

## 2017-03-16 LAB — GLUCOSE, CAPILLARY
Glucose-Capillary: 99 mg/dL (ref 65–99)
Glucose-Capillary: 99 mg/dL (ref 65–99)

## 2017-03-16 MED ORDER — METOPROLOL TARTRATE 25 MG PO TABS: 12.5000 mg | ORAL_TABLET | Freq: Two times a day (BID) | ORAL | 0 refills | Status: DC

## 2017-03-16 MED ORDER — ENSURE ENLIVE PO LIQD: 237.0000 mL | Freq: Two times a day (BID) | ORAL | 12 refills | Status: DC

## 2017-03-16 MED ORDER — POTASSIUM CHLORIDE 20 MEQ/15ML (10%) PO SOLN
40.0000 meq | Freq: Once | ORAL | Status: AC
  Administered 2017-03-16: 40 meq via ORAL
  Filled 2017-03-16: qty 30

## 2017-03-16 MED ORDER — APIXABAN 5 MG PO TABS: 5.0000 mg | ORAL_TABLET | Freq: Two times a day (BID) | ORAL | 0 refills | Status: DC

## 2017-03-16 MED ORDER — DILTIAZEM HCL ER COATED BEADS 120 MG PO CP24: 120.0000 mg | ORAL_CAPSULE | Freq: Every day | ORAL | 0 refills | Status: AC

## 2017-03-16 MED ORDER — DILTIAZEM HCL ER COATED BEADS 120 MG PO CP24: 120.0000 mg | ORAL_CAPSULE | Freq: Every day | ORAL | Status: DC

## 2017-03-16 NOTE — Progress Notes (Addendum)
Patient will discharge to [facility] SNF Anticipated discharge date: 9/4 Family notified: dtr at bedside Transportation by [facility]- scheduled for 2:30pm  CSW signing off.  Jorge Ny, LCSW Clinical Social Worker [phone]

## 2017-03-16 NOTE — Discharge Instructions (Signed)

## 2017-03-16 NOTE — Progress Notes (Signed)
[facility] Progress Note  7 Days Post-Op  Subjective: CC: no complaints Wanting to eat some real food. Denies abdominal pain, nausea, vomiting. Mildly confused.   Objective: Vital signs in last 24 hours: Temp:  [98 F (36.7 C)-98.1 F (36.7 C)] 98 F (36.7 C) (09/04 0602) Pulse Rate:  [74-91] 74 (09/04 1022) Resp:  [17-23] 17 (09/04 0602) BP: (89-98)/(46-57) 94/46 (09/04 1022) SpO2:  [94 %-99 %] 95 % (09/04 0602) Weight:  [67.4 kg (148 lb 9.4 oz)] 67.4 kg (148 lb 9.4 oz) (09/04 0500) Last BM Date: 03/15/17  Intake/Output from previous day: 09/03 0701 - 09/04 0700 In: 240 [P.O.:240] Out: 1250 [Urine:625; Stool:625] Intake/Output this shift: No intake/output data recorded.  PE: Gen:  Alert, NAD, pleasant Card:  Regular rate and rhythm Pulm:  Normal effort, clear to auscultation bilaterally Abd: Soft, non-tender, non-distended, bowel sounds present in all 4 quadrants, no HSM, incision C/D/I with staples in place; stoma pink, soft brown stool in bag  Skin: warm and dry, no rashes  Psych: A&Ox3   Lab Results:   Recent Labs  03/14/17 0451  WBC 11.9*  HGB 15.0  HCT 44.4  PLT 178   BMET  Recent Labs  03/14/17 0451 03/15/17 0347  NA 145 143  K 3.2* 3.3*  CL 119* 121*  CO2 16* 14*  GLUCOSE 122* 120*  BUN 30* 28*  CREATININE 0.98 0.92  CALCIUM 8.7* 8.4*   PT/INR No results for input(s): LABPROT, INR in the last 72 hours. CMP     Component Value Date/Time   NA 143 03/15/2017 0347   K 3.3 (L) 03/15/2017 0347   CL 121 (H) 03/15/2017 0347   CO2 14 (L) 03/15/2017 0347   GLUCOSE 120 (H) 03/15/2017 0347   BUN 28 (H) 03/15/2017 0347   CREATININE 0.92 03/15/2017 0347   CALCIUM 8.4 (L) 03/15/2017 0347   PROT 4.8 (L) 03/14/2017 1259   ALBUMIN 1.7 (L) 03/14/2017 1259   AST 98 (H) 03/14/2017 1259   ALT 157 (H) 03/14/2017 1259   ALKPHOS 121 03/14/2017 1259   BILITOT 0.9 03/14/2017 1259   GFRNONAA 53 (L) 03/15/2017 0347   GFRAA >60 03/15/2017 0347    Lipase     Component Value Date/Time   LIPASE 30 03/04/2017 1822       Studies/Results: Dg Chest Port 1 View  Result Date: 03/14/2017 CLINICAL DATA:  Increased cough. EXAM: PORTABLE CHEST 1 VIEW COMPARISON:  03/07/2017. FINDINGS: Normal sized heart. Interval dense left lower lobe airspace opacity. Poor inspiration with minimal bibasilar atelectasis. Diffuse osteopenia. IMPRESSION: 1. Dense left lower lobe atelectasis or pneumonia. 2. Poor inspiration with minimal bibasilar atelectasis. Electronically Signed   By: Claudie Revering M.D.   On: 03/14/2017 13:36    Anti-infectives: Anti-infectives    Start     Dose/Rate Route Frequency Ordered Stop   03/09/17 1230  cefoTEtan (CEFOTAN) 2 g in dextrose 5 % 50 mL IVPB  Status:  Discontinued     2 g 100 mL/hr over 30 Minutes Intravenous Every 12 hours 03/09/17 1122 03/09/17 1844       Assessment/Plan Abdominal pain, constipation, nausea and vomiting High grade rectosigmoid obstruction Obstructive colon mass,with metastasis to the liver and peritoneium S/p exploratory laparotomy with diverting colostomy, 03/09/17,Dr. Judeth Horn, Colon bx negative, frozen section preliminary - Adenocarcinoma Flexible sigmoidoscopy 03/08/17: obstruction at 20-25 cm Chronic renal disease Chronic consipation Movement disorder Hx of CVA Bilateral hip replacements  FEN: soft diet  ID: Pre op Cefotetan DVT: SCD/Eliquis  Plan: SNF. Advance to soft diet. Stable for discharge to SNF.   LOS: 9 days  Brigid Re , [facility] 03/16/2017, 10:40 AM Pager: [phone] Consults: [phone] Mon-Fri 7:00 am-4:30 pm Sat-Sun 7:00 am-11:30 am

## 2017-03-16 NOTE — Addendum Note (Signed)
Addendum  created 03/16/17 1618 by Lillia Abed, MD   Anesthesia Attestations filed, Anesthesia Staff edited

## 2017-03-16 NOTE — Clinical Social Work Placement (Signed)
   CLINICAL SOCIAL WORK PLACEMENT  NOTE  Date:  03/16/2017  Patient Details  Name: [name] MRN: [id number] Date of Birth: Dec 19, 1923  Clinical Social Work is seeking post-discharge placement for this patient at the [place] level of care (*CSW will initial, date and re-position this form in  chart as items are completed):  Yes   Patient/family provided with [place] Work Department's list of facilities offering this level of care within the geographic area requested by the patient (or if unable, by the patient's family).  Yes   Patient/family informed of their freedom to choose among providers that offer the needed level of care, that participate in Medicare, Medicaid or managed care program needed by the patient, have an available bed and are willing to accept the patient.  Yes   Patient/family informed of [facility]'s ownership interest in [facility] and [facility], as well as of the fact that they are under no obligation to receive care at these facilities.  PASRR submitted to EDS on 03/10/17     PASRR number received on 03/10/17     Existing PASRR number confirmed on       FL2 transmitted to all facilities in geographic area requested by pt/family on 03/10/17     FL2 transmitted to all facilities within larger geographic area on       Patient informed that his/her managed care company has contracts with or will negotiate with certain facilities, including the following:        Yes   Patient/family informed of bed offers received.  Patient chooses bed at [facility]     Physician recommends and patient chooses bed at      Patient to be transferred to [facility] on 03/16/17.  Patient to be transferred to facility by ptar     Patient family notified on 03/16/17 of transfer.  Name of family member notified:  dtr     PHYSICIAN Please sign DNR, Please sign FL2     Additional Comment:     _______________________________________________ Jorge Ny, LCSW 03/16/2017, 1:42 PM

## 2017-03-16 NOTE — Progress Notes (Signed)
Physical Therapy Treatment Patient Details Name: [name] MRN: [id number] DOB: 08-06-1923 Today's Date: 03/16/2017    History of Present Illness 83 y.o. female with history of TIA, arthritis and movement disorder admitted with high grade rectosigmoid obstruction with a colon mass with mets to liver and peritoneum. She underwent exp lap with diverting colostomy on 8/28. Biopsy showed adeno carcinoma.    PT Comments    Pt performed increased gait but remains to require significant assistance to ambulate short distance in room.  Will recommend ambulance transport to rehab center.  Pt fatigued quickly during session.     Follow Up Recommendations  SNF;Supervision for mobility/OOB     Equipment Recommendations   (ambulance transport for safe arrival to rehab.  )    Recommendations for Other Services       Precautions / Restrictions Precautions Precautions: Fall Precaution Comments: colostomy bag and abdominal incision, gait belt placed high for comfort and to avoid surgical site.   Restrictions Weight Bearing Restrictions: No Other Position/Activity Restrictions: colostomy    Mobility  Bed Mobility               General bed mobility comments: Pt sitting in recliner on arrival.    Transfers Overall transfer level: Needs assistance Equipment used: Rolling walker (2 wheeled) Transfers: Sit to/from Stand Sit to Stand: Mod assist         General transfer comment: Cues for hand placement to and from seated surface.  Pt attempted to stand without support and unable to clear bottom.  Pt then required mod assist to boost into standing.  Pt impulsive when return to seated surface.  Pt despite cues for safety only able to back her L hip to chair and side sit on edge of recliner.  Assist provided for safety.    Ambulation/Gait Ambulation/Gait assistance: Mod assist Ambulation Distance (Feet): 35 Feet Assistive device: Rolling walker (2 wheeled) Gait Pattern/deviations:  Step-to pattern;Decreased stride length;Shuffle;Trunk flexed;Decreased step length - left;Decreased step length - right Gait velocity: very slow Gait velocity interpretation: Below normal speed for age/gender General Gait Details: Pt remains to keep RLE ER and behind her during gait.  Cues for increasing R stride length and maintaining position in RW.  Pt begins to flex more at hip and trunk when she fatigues.  Frequent cueing for postural awareness.     Stairs            Wheelchair Mobility    Modified Rankin (Stroke Patients Only)       Balance Overall balance assessment: Needs assistance   Sitting balance-Leahy Scale: Fair       Standing balance-Leahy Scale: Poor Standing balance comment: Reliant on external assistance.                             Cognition Arousal/Alertness: Awake/alert Behavior During Therapy: WFL for tasks assessed/performed Overall Cognitive Status: Impaired/Different from baseline Area of Impairment: Orientation;Memory;Attention;Safety/judgement;Following commands;Awareness;Problem solving                 Orientation Level: Disoriented to;Time;Situation Current Attention Level: Selective Memory: Decreased short-term memory Following Commands: Follows one step commands consistently;Follows multi-step commands inconsistently Safety/Judgement: Decreased awareness of safety;Decreased awareness of deficits Awareness: Intellectual Problem Solving: Slow processing;Difficulty sequencing General Comments: pt pleasantly confused at times thoughout session      Exercises      General Comments        Pertinent Vitals/Pain Pain Assessment: 0-10 Pain Score:  3  Pain Location: abdomen Pain Descriptors / Indicators: Sore Pain Intervention(s): Monitored during session;Repositioned    Home Living                      Prior Function            PT Goals (current goals can now be found in the care plan section) Acute  Rehab PT Goals Patient Stated Goal: not stated Potential to Achieve Goals: Good Progress towards PT goals: Progressing toward goals    Frequency    Min 3X/week      PT Plan Current plan remains appropriate    Co-evaluation              AM-PAC PT "6 Clicks" Daily Activity  Outcome Measure  Difficulty turning over in bed (including adjusting bedclothes, sheets and blankets)?: Unable Difficulty moving from lying on back to sitting on the side of the bed? : Unable Difficulty sitting down on and standing up from a chair with arms (e.g., wheelchair, bedside commode, etc,.)?: Unable Help needed moving to and from a bed to chair (including a wheelchair)?: A Lot Help needed walking in hospital room?: A Lot Help needed climbing 3-5 steps with a railing? : Total 6 Click Score: 8    End of Session Equipment Utilized During Treatment: Gait belt (placed high to avoid abdominal incision and colostomy bag.  ) Activity Tolerance: Patient tolerated treatment well Patient left: in chair;with call bell/phone within reach;with chair alarm set Nurse Communication: Mobility status PT Visit Diagnosis: Other abnormalities of gait and mobility (R26.89);Difficulty in walking, not elsewhere classified (R26.2)     Time: 3151-7616 PT Time Calculation (min) (ACUTE ONLY): 17 min  Charges:  $Gait Training: 8-22 mins                    G Codes:       Governor Rooks, PTA pager [place] 03/16/2017, 1:21 PM

## 2017-03-16 NOTE — Progress Notes (Signed)
Pt was sitting up in the recliner. Had lunch. Denies pain at this time. Pt's daughter at the bedside. Discharged pt to SNF via [facility]. A copy of discharge instructions was given to the daughter.

## 2017-03-16 NOTE — Progress Notes (Deleted)
,  PROGRESS NOTE    [name]  ZOX:[id number] DOB: 31-Jan-1924 DOA: 03/07/2017 PCP: Antony Contras, MD   Brief Narrative: [name] a 83 y.o.femalewith history of TIA presents to the ER because of persistent abdominal pain with nausea vomiting. She was found to have high grade rectosigmoid obstruction with a colon mass with mets to liver and peritoneum. She underwent exp lap with diverting colostomy on 8/28.  Currently she is doing better, surgery recommended advancing his diet.   Assessment & Plan:   Principal Problem:   Large bowel obstruction (HCC) Active Problems:   History of CVA in adulthood   Bowel obstruction (HCC)   Atrial fibrillation with RVR (HCC)   Pressure injury of skin   Colon mass:  causing rectosigmoid obstruction leading to symptoms.  Underwent exp lap with diverting colostomy.  Pain control and symptom management.  Surgery on board and appreciate their recommendations.  Started on clears last night and advancing diet as per surgery recommendations.  Biopsy from the colon mass came back negative for malignancy.  Colostomy bag is leaking today. woc consulted.     AKI: with stage 3 CKD: suspect from dehydration and decreased po intake.  On IV fluids with normal saline.  Repeat bmp ordered and shows improvement. Stop the IV fluids. Today.     Hypokalemia: replete as needed. Keep it greater than 4. And magnesium greater than 2.   H/o TIA:  On aspirin at home, which is on hold for now.  Since she will be started on eliquis , will hold the aspirin.    New onset a fib overnight, paroxysmal in nature:  Initially responded to IV metoprolol, and converted to sinus, she went back into afib and required cardizem gtt, transitioned to oral cardizem.  Transferred to step down.  Echocardiogram ordered showed grade 1 diastolic dysfunction, and troponin elevated at 0.57.  TSh wnl.  Suspect brought on by dehydration and electrolyte abnormalities. Since her  Mali 2 VASC is around 5, discussed with daughter on 9/1 about anticoagulation. She agreed to go ahead with anti coagulation, but her mom today did not want to be started on , as per conversations with cardiology.  Started her on eliquis.    Leukocytosis:  Unclear etiology, cxr left lower atelectasis. Afebrile. Leukocytosis improving.     Elevated liver function test:  Unclear etiology. She reports some mild abdominal discomfort from the surgery.  Repeat liver function tests in am show improvement.   DVT prophylaxis: lovenox.  Code Status: DNR Family Communication: NONE at bedside.  Disposition Plan: SNF tomorrow.   Consultants:   Surgery.   Cardiology     Procedures: exp lap   Antimicrobials: pre op antibiotics.    Subjective: Right arm swollen.  From IV leakage. No tenderness.   Objective: Vitals:   03/15/17 2047 03/16/17 0500 03/16/17 0602 03/16/17 1022  BP: (!) 98/56  (!) 97/56 (!) 94/46  Pulse: 87  86 74  Resp: 20  17   Temp: 98 F (36.7 C)  98 F (36.7 C)   TempSrc: Axillary  Axillary   SpO2: 96%  95%   Weight:  67.4 kg (148 lb 9.4 oz)    Height:        Intake/Output Summary (Last 24 hours) at 03/16/17 1113 Last data filed at 03/16/17 0601  Gross per 24 hour  Intake              120 ml  Output  1125 ml  Net            -1005 ml   Filed Weights   03/14/17 0328 03/15/17 0532 03/16/17 0500  Weight: 63.5 kg (140 lb) 67.6 kg (149 lb) 67.4 kg (148 lb 9.4 oz)    Examination:  General exam: Appears calm and comfortable not in any distress.  Respiratory system: clear , air entry fair.  Cardiovascular system: S1 & S2 heard, RRR. No JVD, murmurs, rubs, gallops or clicks. 1+ LEG edema.  Gastrointestinal system: soft non tender, colostomy bag with liquid stool. Non distended. Central nervous system: Alert and oriented to place and person. No focal neurological deficits. Extremities: Symmetric 5 x 5 power. 1+ leg edema, and right arm swollen  from IV leak.  Skin: No rashes, lesions or ulcers Psychiatry:  Mood & affect appropriate.     Data Reviewed: I have personally reviewed following labs and imaging studies  CBC:  Recent Labs Lab 03/10/17 0606 03/11/17 1012 03/13/17 0508 03/14/17 0451  WBC 4.1 6.4 14.5* 11.9*  NEUTROABS  --  5.2  --   --   HGB 12.6 13.6 14.7 15.0  HCT 40.2 43.1 46.0 44.4  MCV 91.2 91.3 90.0 87.2  PLT 200 199 212 269   Basic Metabolic Panel:  Recent Labs Lab 03/10/17 0606  03/12/17 1303 03/13/17 0508 03/13/17 1048 03/13/17 1329 03/14/17 0451 03/14/17 1259 03/14/17 1447 03/15/17 0347  NA 143  < > 140 144 142  --  145  --   --  143  K 4.2  < > 3.3* 3.5 2.7*  --  3.2*  --   --  3.3*  CL 112*  < > 115* 119* 117*  --  119*  --   --  121*  CO2 25  < > 19* 14* 20*  --  16*  --   --  14*  GLUCOSE 193*  < > 131* 119* 145*  --  122*  --   --  120*  BUN 29*  < > 32* 32* 30*  --  30*  --   --  28*  CREATININE 1.04*  < > 1.27* 1.08* 1.19*  --  0.98  --   --  0.92  CALCIUM 8.3*  < > 8.1* 8.8* 8.7*  --  8.7*  --   --  8.4*  MG 2.1  --  1.8  --   --  1.7  --  QUANTITY NOT SUFFICIENT, UNABLE TO PERFORM TEST 2.2  --   PHOS  --   --  2.8  --   --   --   --   --   --   --   < > = values in this interval not displayed. GFR: Estimated Creatinine Clearance: 36.7 mL/min (by C-G formula based on SCr of 0.92 mg/dL). Liver Function Tests:  Recent Labs Lab 03/13/17 0508 03/14/17 1259  AST 84* 98*  ALT 126* 157*  ALKPHOS 138* 121  BILITOT 0.9 0.9  PROT 4.8* 4.8*  ALBUMIN 1.9* 1.7*   No results for input(s): LIPASE, AMYLASE in the last 168 hours. No results for input(s): AMMONIA in the last 168 hours. Coagulation Profile: No results for input(s): INR, PROTIME in the last 168 hours. Cardiac Enzymes:  Recent Labs Lab 03/12/17 0040 03/12/17 0524 03/12/17 1303 03/13/17 1329  TROPONINI <0.03 0.03* 0.05* 0.57*   BNP (last 3 results) No results for input(s): PROBNP in the last 8760  hours. HbA1C: No results for input(s): HGBA1C in  the last 72 hours. CBG:  Recent Labs Lab 03/15/17 0356 03/15/17 1200 03/15/17 1553 03/16/17 0155 03/16/17 0819  GLUCAP 112* 111* 102* 99 99   Lipid Profile: No results for input(s): CHOL, HDL, LDLCALC, TRIG, CHOLHDL, LDLDIRECT in the last 72 hours. Thyroid Function Tests: No results for input(s): TSH, T4TOTAL, FREET4, T3FREE, THYROIDAB in the last 72 hours. Anemia Panel: No results for input(s): VITAMINB12, FOLATE, FERRITIN, TIBC, IRON, RETICCTPCT in the last 72 hours. Sepsis Labs: No results for input(s): PROCALCITON, LATICACIDVEN in the last 168 hours.  Recent Results (from the past 240 hour(s))  MRSA PCR Screening     Status: None   Collection Time: 03/09/17 11:52 AM  Result Value Ref Range Status   MRSA by PCR NEGATIVE NEGATIVE Final    Comment:        The GeneXpert MRSA Assay (FDA approved for NASAL specimens only), is one component of a comprehensive MRSA colonization surveillance program. It is not intended to diagnose MRSA infection nor to guide or monitor treatment for MRSA infections.          Radiology Studies: Dg Chest Port 1 View  Result Date: 03/14/2017 CLINICAL DATA:  Increased cough. EXAM: PORTABLE CHEST 1 VIEW COMPARISON:  03/07/2017. FINDINGS: Normal sized heart. Interval dense left lower lobe airspace opacity. Poor inspiration with minimal bibasilar atelectasis. Diffuse osteopenia. IMPRESSION: 1. Dense left lower lobe atelectasis or pneumonia. 2. Poor inspiration with minimal bibasilar atelectasis. Electronically Signed   By: Claudie Revering M.D.   On: 03/14/2017 13:36        Scheduled Meds: . acetaminophen (TYLENOL) oral liquid 160 mg/5 mL  650 mg Oral Q6H  . apixaban  5 mg Oral BID  . diltiazem  120 mg Oral Daily  . famotidine  20 mg Oral Daily  . feeding supplement (ENSURE ENLIVE)  237 mL Oral BID BM  . mouth rinse  15 mL Mouth Rinse BID  . metoprolol tartrate  12.5 mg Oral BID    Continuous Infusions: . sodium chloride    . lactated ringers 10 mL/hr at 03/09/17 1311  . sodium chloride       LOS: 9 days    Time spent: 35 minutes.     Hosie Poisson, MD [facility] Pager [phone]  If 7PM-7AM, please contact night-coverage [email or web address] Password [email or web address] 03/16/2017, 11:13 AM

## 2017-03-16 NOTE — Consult Note (Signed)
           [facility] CM Primary Care Navigator  03/16/2017  [name] Oct 21, 1923 [id number]   Went to see patient at the bedside to identify possible discharge needs but she was already discharged per staff report.  Patient went to skilled nursing facility([facility])for continued rehabilitation post acute discharge per therapy recommendation.    For questions, please contact:  Dannielle Huh, BSN, RN- [facility] Primary Care Navigator  Telephone: [phone] [name]

## 2017-03-16 NOTE — Discharge Summary (Signed)
Physician Discharge Summary  [name] VHQ:[id number] DOB: 04/17/1924 DOA: 03/07/2017  PCP: Antony Contras, MD  Admit date: 03/07/2017 Discharge date: 03/16/2017  Admitted From: Home.  Disposition: SNF [facility].  Recommendations for Outpatient Follow-up:  1. Follow up with PCP in 1-2 weeks 2. Please obtain BMP/CBC in one week Please follow up with general surgery as needed.  Please follow up the liver function panel in 2 weeks.  Please follow up with oncology regarding the colon mass.  Please follow up with repeat CT abdomen in 2 years for surveillance of the pancreatic cyst.   Discharge Condition:stable.  CODE STATUS:DNR Diet recommendation: Heart Healthy   Brief/Interim Summary:  [name] a 83 y.o.femalewith history of TIA presents to the ER because of persistent abdominal pain with nausea vomiting. She was found to have high grade rectosigmoid obstruction with a colon mass with mets to liver and peritoneum. She underwent exp lap with diverting colostomy on 8/28.  Currently she is doing better, advanced her diet to soft diet. Meanwhile she went in to new onset afib with RVR.she was started on cardizem , lopressor and cardiology consulted, who recommended starting her on eliquis.   Discharge Diagnoses:  Principal Problem:   Large bowel obstruction ([place]) Active Problems:   History of CVA in adulthood   Bowel obstruction (HCC)   Atrial fibrillation with RVR (HCC)   Pressure injury of skin  Colon mass:  causing rectosigmoid obstruction leading to symptoms.  Initially Gastroenterology consulted, s/p Flexible sigmoidoscopy: Nonthrombosed external hemorrhoids found on. On exam. No specimens collected. Significant narrowing at 20-25 cm from anal verge at rectosigmoid junction which with advancement of the pediatric colonoscope. Sequelae EGD scope was used but still unable to advance scope. Surgery consulted and she Underwent exp lap with diverting colostomy.  Pain control  and symptom management.   Started on clears and advancing diet as per surgery recommendations. Currently she is on soft diet and is able to tolerate.  Biopsy from the colon mass came back negative for malignancy.      AKI: with stage 3 CKD: suspect from dehydration and decreased po intake.  Started her on IV fluids and her creatinine improved.     Hypokalemia: replete as needed. Keep it greater than 4. And magnesium greater than 2.   H/o TIA:  On aspirin at home, which is on hold for now.  Since she will be started on eliquis , will not start aspirin.    New onset a fib, paroxysmal in nature:  Initially responded to IV metoprolol, and converted to sinus, she went back into afib and required cardizem gtt, transitioned to oral cardizem.  Echocardiogram ordered showed grade 1 diastolic dysfunction, and troponin elevated at 0.57.  TSh wnl.  Suspect brought on by dehydration and electrolyte abnormalities. Cardiology consulted, recommended to start her on Eliquis. Since her [place] 2 VASC is around 5, she will be started on eliquis for anti coagulation.   Leukocytosis:  Unclear etiology, cxr left lower atelectasis. Afebrile. Leukocytosis improving.     Elevated liver function test:  Unclear etiology. She reports some mild abdominal discomfort from the surgery.  Repeat LFTS' in 2 weeks as outpatient and follow up with PCP.     Discharge Instructions  Discharge Instructions    Diet - low sodium heart healthy    Complete by:  As directed    Discharge instructions    Complete by:  As directed    Please follow up with surgery as recommended.  Please  follow up with oncology as recommended.     Allergies as of 03/16/2017   No Known Allergies     Medication List    STOP taking these medications   ALPRAZolam 0.5 MG tablet Commonly known as:  XANAX   aspirin 81 MG chewable tablet   diphenhydrAMINE 25 mg capsule Commonly known as:  BENADRYL   ferrous sulfate  325 (65 FE) MG tablet     TAKE these medications   apixaban 5 MG Tabs tablet Commonly known as:  ELIQUIS Take 1 tablet (5 mg total) by mouth 2 (two) times daily.   diltiazem 120 MG 24 hr capsule Commonly known as:  CARDIZEM CD Take 1 capsule (120 mg total) by mouth daily.   feeding supplement (ENSURE ENLIVE) Liqd Take 237 mLs by mouth 2 (two) times daily between meals.   metoprolol tartrate 25 MG tablet Commonly known as:  LOPRESSOR Take 0.5 tablets (12.5 mg total) by mouth 2 (two) times daily.   Vitamin D 2000 units tablet Take 2,000 Units by mouth every morning.            Discharge Care Instructions        Start     Ordered   03/17/17 0000  diltiazem (CARDIZEM CD) 120 MG 24 hr capsule  Daily     03/16/17 1115   03/16/17 0000  apixaban (ELIQUIS) 5 MG TABS tablet  2 times daily     03/16/17 1115   03/16/17 0000  feeding supplement, ENSURE ENLIVE, (ENSURE ENLIVE) LIQD  2 times daily between meals     03/16/17 1115   03/16/17 0000  metoprolol tartrate (LOPRESSOR) 25 MG tablet  2 times daily     03/16/17 1115   03/16/17 0000  Diet - low sodium heart healthy     03/16/17 1115   03/16/17 0000  Discharge instructions    Comments:  Please follow up with surgery as recommended.  Please follow up with oncology as recommended.   03/16/17 1115      Contact information for follow-up providers    Antony Contras, MD. Schedule an appointment as soon as possible for a visit in 1 week(s).   Specialty:  Family Medicine Contact information: [place] [address] [phone]        Surgery, [place] Follow up.   Specialty:  General Surgery Why:  as needed.  Contact information: [address] [place]  [address] [phone]            Contact information for after-discharge care    Destination    [facility] Follow up.   Specialty:  [place]  information: [address] [place] [phone]                 No Known Allergies  Consultations:  Cardiology  Surgery.   GI   Procedures/Studies: Ct Abdomen Pelvis W Contrast  Result Date: 03/07/2017 CLINICAL DATA:  Abdominal distension and lower abdominal pain for 2 weeks. History of chronic kidney disease. EXAM: CT ABDOMEN AND PELVIS WITH CONTRAST TECHNIQUE: Multidetector CT imaging of the abdomen and pelvis was performed using the standard protocol following bolus administration of intravenous contrast. CONTRAST:  130mL ISOVUE-300 IOPAMIDOL (ISOVUE-300) INJECTION 61% COMPARISON:  CT abdomen and pelvis March 04, 2017 FINDINGS: LOWER CHEST: Lung bases are clear. Included heart size is normal. No pericardial effusion. HEPATOBILIARY: Vicarious excretion of contrast in the gallbladder which is  mildly distended, otherwise unremarkable. Mild periportal edema, liver is otherwise normal. PANCREAS: Re- demonstration of 14 mm lobular cystic mass in uncinate of pancreas. Given patient's age of greater than 34 years old, recommend follow-up CT in 2 years. SPLEEN: 14 mm benign-appearing cyst within the spleen, unchanged. ADRENALS/URINARY TRACT: Kidneys are orthotopic, demonstrating symmetric enhancement. No nephrolithiasis, hydronephrosis or solid renal masses. The unopacified ureters are normal in course and caliber. Delayed imaging through the kidneys demonstrates symmetric prompt contrast excretion within the proximal urinary collecting system. Urinary bladder is partially distended and unremarkable. Normal adrenal glands. STOMACH/BOWEL: Large amount of retained large bowel stool with transition point in the sigmoid colon associated with wall thickening, potential mass. Limited assessment due to streak artifact from hip arthroplasties. Small bowel feces and small and large bowel air-fluid levels. Probable gastric antral diverticulum. VASCULAR/LYMPHATIC:  Aortoiliac vessels are normal in course and caliber. Mild aortic atherosclerosis. No lymphadenopathy by CT size criteria. REPRODUCTIVE: Status post hysterectomy. OTHER: Small volume ascites. No intraperitoneal free air or drainable fluid collection. MUSCULOSKELETAL: Nonacute. Streak artifact from bilateral hip total arthroplasties. Grade 1 L4-5 grade 1 L5-S1 anterolisthesis on degenerative basis. IMPRESSION: 1. Persistent distal large bowel obstruction with transition point in sigmoid colon concerning for neoplasm, less likely stricture. Recommend colonoscopy. Large volume retained large bowel stool. 2. Increasing small volume ascites. 3. Re- demonstration of 14 mm pancreatic cystic mass. Considering patient's age of greater than 61, recommend follow-up CT in 2 years. This recommendation follows ACR consensus guidelines: Management of Incidental Pancreatic Cysts: A White Paper of the ACR Incidental Findings Committee. Rosalie [id number]. Aortic Atherosclerosis (ICD10-I70.0). Electronically Signed   By: Elon Alas M.D.   On: 03/07/2017 19:09   Ct Abdomen Pelvis W Contrast  Result Date: 03/05/2017 CLINICAL DATA:  83 year old with abdominal pain and distension. EXAM: CT ABDOMEN AND PELVIS WITH CONTRAST TECHNIQUE: Multidetector CT imaging of the abdomen and pelvis was performed using the standard protocol following bolus administration of intravenous contrast. CONTRAST:  75 cc Isovue 300 IV COMPARISON:  Radiograph 02/01/2017. FINDINGS: Lower chest: Linear atelectasis in the left lower lobe. No confluent consolidation. No pleural effusion. Hepatobiliary: No focal hepatic lesion. The gallbladder is physiologically distended, displaced anteriorly without gallbladder inflammation. There is mild intrahepatic biliary ductal dilatation. Common bile duct is not well-defined. Pancreas: Parenchymal atrophy. No ductal dilatation or inflammation. There is a 1.3 x 1.1 cm cystic structure adjacent to the  uncinate process of pancreas. Spleen: Small inside with irregular contours. Ill-defined low-density lesions in the lower spleen. Minimal adjacent fluid/ edema. Adrenals/Urinary Tract: No adrenal nodule. No hydronephrosis or perinephric edema. Homogeneous enhancement with symmetric excretion on delayed phase imaging. Urinary bladder is obscured by streak artifact from bilateral hip prostheses. Stomach/Bowel: Marked colonic stool burden with large volume of stool in the ascending, transverse, descending and proximal sigmoid colon with associated tortuosity. Probable transition in the mid sigmoid image 61 series 2 with possible wall thickening. The distal most sigmoid colon appears decompressed, however are partially obscured by streak artifact from adjacent hip prosthesis. No evidence of volvulus. No small bowel dilatation. There is mild fecalization of distal small bowel contents in keeping with slow transit. Appendix is not visualized. Vascular/Lymphatic: Mild aortic atherosclerosis tortuosity. No definite abdominal or pelvic adenopathy we common pelvis partially obscured by streak artifact. Reproductive: Pelvis and adnexa are obscured by streak artifact. Uterus not definitively seen. Other: No abdominal ascites, cannot assess for pelvic ascites. No free air. Musculoskeletal: Multilevel degenerative change in the lumbar spine, degenerative  type anterolisthesis of L4 on L5 and L5 on S1. Bilateral hip arthroplasties. No focal bone lesion. IMPRESSION: 1. Very large stool burden and colonic tortuosity resulting in abdominal distension. Transition in the mid sigmoid in the regional possible wall thickening, distal most sigmoid colon is decompressed. This may be due to stricture, neoplasm, less likely peristalsis. Recommend direct visualization with colonoscopy. 2. No small bowel dilatation or obstruction. 3. Small size spleen with irregular contours, low-density lesions inferiorly. There is also mild adjacent  edema/stranding. No prior exams available for comparison. Splenic lesions are typically benign, however if colonic neoplasm is demonstrated metastatic disease is not excluded. Alternatively this may be sequela of prior splenic infarct. 4. A 1.3 cm cystic lesion adjacent to the uncinate process of pancreas. Given patient's age (greater than 1 years) recommend follow-up CT in 2 years. 5. Mild intrahepatic biliary ductal dilatation without identifiable cause. No abnormal gallbladder distention. 6.  Aortic Atherosclerosis (ICD10-I70.0). Electronically Signed   By: Jeb Levering M.D.   On: 03/05/2017 00:29   Dg Chest Port 1 View  Result Date: 03/14/2017 CLINICAL DATA:  Increased cough. EXAM: PORTABLE CHEST 1 VIEW COMPARISON:  03/07/2017. FINDINGS: Normal sized heart. Interval dense left lower lobe airspace opacity. Poor inspiration with minimal bibasilar atelectasis. Diffuse osteopenia. IMPRESSION: 1. Dense left lower lobe atelectasis or pneumonia. 2. Poor inspiration with minimal bibasilar atelectasis. Electronically Signed   By: Claudie Revering M.D.   On: 03/14/2017 13:36   Dg Abd Acute W/chest  Result Date: 03/07/2017 CLINICAL DATA:  Vomiting for several hours EXAM: DG ABDOMEN ACUTE W/ 1V CHEST COMPARISON:  None. FINDINGS: Cardiac shadow is within normal limits. The lungs are well aerated bilaterally. Mild aortic calcifications are seen. Scattered large and small bowel gas is noted. Mild small bowel dilatation is noted consistent with at least a partial small bowel obstruction. These changes are new from the prior exam. Degenerative changes of lumbar spine are seen. Bilateral hip replacements are noted. IMPRESSION: Ages consistent with ileus partial small bowel obstruction. Electronically Signed   By: Inez Catalina M.D.   On: 03/07/2017 17:21   Dg Colon W/cm Ltd  Result Date: 03/08/2017 CLINICAL DATA:  83 year old female with large bowel obstruction. Unable to advance scope beyond the rectosigmoid junction  today. EXAM: BE LIMITED WITH CONTRAST CONTRAST:  Isovue 300 was administered rectally. FLUOROSCOPY TIME:  Fluoroscopy Time:  1 minutes 36 seconds Number of Acquired Spot Images: 14 COMPARISON:  CT 03/07/2017 FINDINGS: Scout image demonstrates marked air-filled dilatation dilatation of both large and small bowel. There air-fluid levels on the lateral decubitus view. Note is made of contrast opacification within the bladder. There are bilateral hip replacements. Contrast is seen filling the lumen of the rectum to the level of the rectosigmoid junction. Contrast material did not flow beyond this area, despite multiple attempts at repositioning the patient. This corresponds with the areas of obstruction identified on recent CT scan. There is no evidence for perforation or leakage of contrast material. The contrast material was subsequently decompressed. The patient tolerated the procedure without difficulty. IMPRESSION: High-grade obstruction at the rectosigmoid junction without passage of contrast proximally. This corresponds with the area of obstruction identified on recent CT scan. Differential includes obstructing mass, and less likely high-grade stricture at this location. Electronically Signed   By: Kristopher Oppenheim M.D.   On: 03/08/2017 17:35    Echocardiogram.    Subjective: No new complaints.   Discharge Exam: Vitals:   03/16/17 0602 03/16/17 1022  BP: (!) 97/56 Marland Kitchen)  94/46  Pulse: 86 74  Resp: 17   Temp: 98 F (36.7 C)   SpO2: 95%    Vitals:   03/15/17 2047 03/16/17 0500 03/16/17 0602 03/16/17 1022  BP: (!) 98/56  (!) 97/56 (!) 94/46  Pulse: 87  86 74  Resp: 20  17   Temp: 98 F (36.7 C)  98 F (36.7 C)   TempSrc: Axillary  Axillary   SpO2: 96%  95%   Weight:  67.4 kg (148 lb 9.4 oz)    Height:        General: Pt is alert, awake, not in acute distress Cardiovascular: RRR, S1/S2 +, no rubs, no gallops Respiratory: CTA bilaterally, no wheezing, no rhonchi Abdominal: Soft, NT, ND,  bowel sounds + Extremities: no edema, no cyanosis    The results of significant diagnostics from this hospitalization (including imaging, microbiology, ancillary and laboratory) are listed below for reference.     Microbiology: Recent Results (from the past 240 hour(s))  MRSA PCR Screening     Status: None   Collection Time: 03/09/17 11:52 AM  Result Value Ref Range Status   MRSA by PCR NEGATIVE NEGATIVE Final    Comment:        The GeneXpert MRSA Assay (FDA approved for NASAL specimens only), is one component of a comprehensive MRSA colonization surveillance program. It is not intended to diagnose MRSA infection nor to guide or monitor treatment for MRSA infections.      Labs: BNP (last 3 results) No results for input(s): BNP in the last 8760 hours. Basic Metabolic Panel:  Recent Labs Lab 03/10/17 0606  03/12/17 1303 03/13/17 0508 03/13/17 1048 03/13/17 1329 03/14/17 0451 03/14/17 1259 03/14/17 1447 03/15/17 0347  NA 143  < > 140 144 142  --  145  --   --  143  K 4.2  < > 3.3* 3.5 2.7*  --  3.2*  --   --  3.3*  CL 112*  < > 115* 119* 117*  --  119*  --   --  121*  CO2 25  < > 19* 14* 20*  --  16*  --   --  14*  GLUCOSE 193*  < > 131* 119* 145*  --  122*  --   --  120*  BUN 29*  < > 32* 32* 30*  --  30*  --   --  28*  CREATININE 1.04*  < > 1.27* 1.08* 1.19*  --  0.98  --   --  0.92  CALCIUM 8.3*  < > 8.1* 8.8* 8.7*  --  8.7*  --   --  8.4*  MG 2.1  --  1.8  --   --  1.7  --  QUANTITY NOT SUFFICIENT, UNABLE TO PERFORM TEST 2.2  --   PHOS  --   --  2.8  --   --   --   --   --   --   --   < > = values in this interval not displayed. Liver Function Tests:  Recent Labs Lab 03/13/17 0508 03/14/17 1259  AST 84* 98*  ALT 126* 157*  ALKPHOS 138* 121  BILITOT 0.9 0.9  PROT 4.8* 4.8*  ALBUMIN 1.9* 1.7*   No results for input(s): LIPASE, AMYLASE in the last 168 hours. No results for input(s): AMMONIA in the last 168 hours. CBC:  Recent Labs Lab  03/10/17 0606 03/11/17 1012 03/13/17 0508 03/14/17 0451  WBC 4.1 6.4 14.5* 11.9*  NEUTROABS  --  5.2  --   --   HGB 12.6 13.6 14.7 15.0  HCT 40.2 43.1 46.0 44.4  MCV 91.2 91.3 90.0 87.2  PLT 200 199 212 178   Cardiac Enzymes:  Recent Labs Lab 03/12/17 0040 03/12/17 0524 03/12/17 1303 03/13/17 1329  TROPONINI <0.03 0.03* 0.05* 0.57*   BNP: Invalid input(s): POCBNP CBG:  Recent Labs Lab 03/15/17 0356 03/15/17 1200 03/15/17 1553 03/16/17 0155 03/16/17 0819  GLUCAP 112* 111* 102* 99 99   D-Dimer No results for input(s): DDIMER in the last 72 hours. Hgb A1c No results for input(s): HGBA1C in the last 72 hours. Lipid Profile No results for input(s): CHOL, HDL, LDLCALC, TRIG, CHOLHDL, LDLDIRECT in the last 72 hours. Thyroid function studies No results for input(s): TSH, T4TOTAL, T3FREE, THYROIDAB in the last 72 hours.  Invalid input(s): FREET3 Anemia work up No results for input(s): VITAMINB12, FOLATE, FERRITIN, TIBC, IRON, RETICCTPCT in the last 72 hours. Urinalysis    Component Value Date/Time   COLORURINE YELLOW 03/02/2012 1125   APPEARANCEUR CLEAR 03/02/2012 1125   LABSPEC 1.016 03/02/2012 1125   PHURINE 6.0 03/02/2012 1125   GLUCOSEU NEGATIVE 03/02/2012 1125   HGBUR NEGATIVE 03/02/2012 1125   BILIRUBINUR NEGATIVE 03/02/2012 1125   KETONESUR NEGATIVE 03/02/2012 1125   PROTEINUR NEGATIVE 03/02/2012 1125   UROBILINOGEN 0.2 03/02/2012 1125   NITRITE NEGATIVE 03/02/2012 1125   LEUKOCYTESUR NEGATIVE 03/02/2012 1125   Sepsis Labs Invalid input(s): PROCALCITONIN,  WBC,  LACTICIDVEN Microbiology Recent Results (from the past 240 hour(s))  MRSA PCR Screening     Status: None   Collection Time: 03/09/17 11:52 AM  Result Value Ref Range Status   MRSA by PCR NEGATIVE NEGATIVE Final    Comment:        The GeneXpert MRSA Assay (FDA approved for NASAL specimens only), is one component of a comprehensive MRSA colonization surveillance program. It is  not intended to diagnose MRSA infection nor to guide or monitor treatment for MRSA infections.      Time coordinating discharge: Over 30 minutes  SIGNED:   Hosie Poisson, MD  [facility] 03/16/2017, 11:16 AM Pager   If 7PM-7AM, please contact night-coverage [email or web address] Password [email or web address]

## 2017-03-16 NOTE — Care Management Important Message (Signed)
Important Message  Patient Details  Name: [name] MRN: [id number] Date of Birth: 1923/11/30   Medicare Important Message Given:  Yes    Rita Vialpando [address] 03/16/2017, 12:55 PM

## 2017-03-18 ENCOUNTER — Encounter: Payer: Self-pay | Admitting: Adult Health

## 2017-03-18 ENCOUNTER — Non-Acute Institutional Stay (SKILLED_NURSING_FACILITY): Payer: Medicare Other | Admitting: Adult Health

## 2017-03-18 DIAGNOSIS — R2681 Unsteadiness on feet: Secondary | ICD-10-CM | POA: Diagnosis not present

## 2017-03-18 DIAGNOSIS — Z933 Colostomy status: Secondary | ICD-10-CM | POA: Diagnosis not present

## 2017-03-18 DIAGNOSIS — E43 Unspecified severe protein-calorie malnutrition: Secondary | ICD-10-CM

## 2017-03-18 DIAGNOSIS — I679 Cerebrovascular disease, unspecified: Secondary | ICD-10-CM | POA: Diagnosis not present

## 2017-03-18 DIAGNOSIS — M6389 Disorders of muscle in diseases classified elsewhere, multiple sites: Secondary | ICD-10-CM | POA: Diagnosis not present

## 2017-03-18 DIAGNOSIS — K56601 Complete intestinal obstruction, unspecified as to cause: Secondary | ICD-10-CM

## 2017-03-18 DIAGNOSIS — K862 Cyst of pancreas: Secondary | ICD-10-CM | POA: Diagnosis not present

## 2017-03-18 DIAGNOSIS — R7401 Elevation of levels of liver transaminase levels: Secondary | ICD-10-CM | POA: Insufficient documentation

## 2017-03-18 DIAGNOSIS — R740 Nonspecific elevation of levels of transaminase and lactic acid dehydrogenase [LDH]: Secondary | ICD-10-CM

## 2017-03-18 DIAGNOSIS — R600 Localized edema: Secondary | ICD-10-CM | POA: Diagnosis not present

## 2017-03-18 DIAGNOSIS — Z9889 Other specified postprocedural states: Secondary | ICD-10-CM | POA: Diagnosis not present

## 2017-03-18 DIAGNOSIS — R2689 Other abnormalities of gait and mobility: Secondary | ICD-10-CM | POA: Diagnosis not present

## 2017-03-18 DIAGNOSIS — M62562 Muscle wasting and atrophy, not elsewhere classified, left lower leg: Secondary | ICD-10-CM | POA: Diagnosis not present

## 2017-03-18 DIAGNOSIS — I4891 Unspecified atrial fibrillation: Secondary | ICD-10-CM

## 2017-03-18 DIAGNOSIS — K56691 Other complete intestinal obstruction: Secondary | ICD-10-CM | POA: Diagnosis not present

## 2017-03-18 DIAGNOSIS — Z4889 Encounter for other specified surgical aftercare: Secondary | ICD-10-CM | POA: Diagnosis not present

## 2017-03-18 DIAGNOSIS — C786 Secondary malignant neoplasm of retroperitoneum and peritoneum: Secondary | ICD-10-CM | POA: Diagnosis not present

## 2017-03-18 DIAGNOSIS — M62561 Muscle wasting and atrophy, not elsewhere classified, right lower leg: Secondary | ICD-10-CM | POA: Diagnosis not present

## 2017-03-18 DIAGNOSIS — R278 Other lack of coordination: Secondary | ICD-10-CM | POA: Diagnosis not present

## 2017-03-18 NOTE — Progress Notes (Signed)
Location:  Occupational psychologist of Service:  SNF (31) Provider:   Cindi Carbon, [place] [phone]  Antony Contras, MD  Patient Care Team: Antony Contras, MD as PCP - General (Family Medicine) Gayland Curry, DO as Consulting Physician (Geriatric Medicine)  Extended Emergency Contact Information Primary Emergency Contact: Hyder,Sharon Address: [address] [place] Phone: [phone] Work Phone: [phone] Mobile Phone: [phone] Relation: Daughter  Code Status:  DNR Goals of care: Advanced Directive information Advanced Directives 03/18/2017  Does Patient Have a Medical Advance Directive? Yes  Type of Advance Directive Living will;Healthcare Power of Attorney  Does patient want to make changes to medical advance directive? -  Copy of [place] in Chart? Yes  Pre-existing out of facility DNR order (yellow form or pink MOST form) -     Chief Complaint  Patient presents with  . Hospitalization Follow-up    HPI:  Pt is a 83 y.o. female seen today for a hospital f/u s/p admission from 03/07/17-03/16/17 due to abd pain. She was found to have a high grade rectosigmoid obstruction with colon mass. She underwent an exp lap with frozen section/loop colostomy on 03/09/17.  She previously lived in [place] and is now residing in skilled rehab for therapy and rehabilitation.  Colon mass: Initially Gastroenterology consulted, s/p Flexible sigmoidoscopy: Nonthrombosed external hemorrhoids found on. On exam. No specimens collected. Significant narrowing at 20-25 cm from anal verge at rectosigmoid junction which with advancement of the pediatric colonoscope. Sequelae EGD scope was used but still unable to advance scope.Surgery consulted and she Underwent exp lap with diverting colostomy.   Path report of colon mass was negative for carcinoma Path report of peritoneum was positive for  metastatic carcinoma, concerning for a gynecologic primary  Her liver enzymes are elevated and trending up over time  98/157 AST/ALT  CT of the abd 03/07/17: Persistent large bowel obstruction with transition point at the sigmoid colon, concerning for neoplasm Re- demonstration of 14 mm lobular cystic mass in uncinate of pancreas. Given patient's age of greater than 42 years old, recommend follow-up CT in 2 years. Small volume ascites noted.  Ms Stamp expresses that she is sad that she now lives in a retirement community and misses her husband and previous home. She expresses further that if she were to die tomorrow she would be content.  She is DNR.   Furthermore, she is expresses discontentment that she has a colostomy and does not wish to participate in the learning process to care for it herself.  With therapy she has walked a short distance but remains weak and tired. Sleeps 12 hours a day. She reports that she has a poor appetite but drinks water regularly. No pain reported. Stool and gas are passing through the colostomy.  She has lots of edema in her lower ext which is new, Alb 1.7.  Refuses supplements as they cause nausea.  During her stay she had afib with RVR.  She is now on eliquis and cardizem on metoprolol to control her HR.     Past Medical History:  Diagnosis Date  . Anxiety   . Arthritis   . Chronic kidney disease   . Constipation   . Movement disorder   . Sinus drainage   . Stroke [facility])    hx of ministroke in 2011   . Vision abnormalities    Past Surgical  History:  Procedure Laterality Date  . COLECTOMY WITH COLOSTOMY CREATION/HARTMANN PROCEDURE N/A 03/09/2017   Procedure: EXPLORATORY LAPAROTOMY WITH FROZEN SECTION;  Surgeon: Judeth Horn, MD;  Location: [facility];  Service: General;  Laterality: N/A;  . COLOSTOMY  03/09/2017   Procedure: LOOP COLOSTOMY;  Surgeon: Judeth Horn, MD;  Location: [facility];  Service: General;;  . EYE SURGERY     bilateral cataract  surgery   . FLEXIBLE SIGMOIDOSCOPY N/A 03/08/2017   Procedure: FLEXIBLE SIGMOIDOSCOPY;  Surgeon: Otis Brace, MD;  Location: [facility];  Service: Gastroenterology;  Laterality: N/A;  . JOINT REPLACEMENT     right hip   . TOTAL HIP ARTHROPLASTY  03/08/2012   Procedure: TOTAL HIP ARTHROPLASTY ANTERIOR APPROACH;  Surgeon: Mauri Pole, MD;  Location: [facility];  Service: Orthopedics;  Laterality: Left;  . tumor removed right breast      benign     No Known Allergies  Allergies as of 03/18/2017   No Known Allergies     Medication List       Accurate as of 03/18/17  3:15 PM. Always use your most recent med list.          apixaban 5 MG Tabs tablet Commonly known as:  ELIQUIS Take 1 tablet (5 mg total) by mouth 2 (two) times daily.   diltiazem 120 MG 24 hr capsule Commonly known as:  CARDIZEM CD Take 1 capsule (120 mg total) by mouth daily.   feeding supplement (ENSURE ENLIVE) Liqd Take 237 mLs by mouth 2 (two) times daily between meals.   Vitamin D 2000 units tablet Take 2,000 Units by mouth every morning.       Review of Systems  Constitutional: Positive for activity change, appetite change and fatigue. Negative for chills, diaphoresis, fever and unexpected weight change.  HENT: Negative for congestion.        Dry mouth  Respiratory: Negative for cough, shortness of breath and wheezing.   Cardiovascular: Negative for chest pain, palpitations and leg swelling.  Gastrointestinal: Negative for abdominal distention, abdominal pain, constipation and diarrhea.       Colostomy  Genitourinary: Negative for difficulty urinating and dysuria.  Musculoskeletal: Positive for gait problem (weak). Negative for arthralgias, back pain, joint swelling and myalgias.  Neurological: Positive for weakness. Negative for dizziness, tremors, seizures, syncope, facial asymmetry, speech difficulty, light-headedness, numbness and headaches.  Psychiatric/Behavioral: Positive for confusion  (slightly). Negative for agitation and behavioral problems.     There is no immunization history on file for this patient. Pertinent  Health Maintenance Due  Topic Date Due  . DEXA SCAN  02/12/1991  . PNA vac Low Risk Adult (1 of 2 - PCV13) 02/12/1991  . INFLUENZA VACCINE  02/10/2017   No flowsheet data found. Functional Status Survey:    Vitals:   03/18/17 1512  BP: 109/72  Pulse: 99  Resp: (!) 23  Temp: 98 F (36.7 C)  SpO2: 95%  Weight: 138 lb 3.2 oz (62.7 kg)   Body mass index is 24.48 kg/m. Physical Exam  Constitutional: She is oriented to person, place, and time. No distress.  HENT:  Head: Normocephalic and atraumatic.  Right Ear: External ear normal.  Left Ear: External ear normal.  Nose: Nose normal.  Mouth/Throat: Oropharynx is clear and moist. No oropharyngeal exudate.  Very dry mouth  Neck: No JVD present.  Cardiovascular:  No murmur heard. Irregular BLE edema +2 with small fluid filled blisters without erythema to the RLE  Pulmonary/Chest: Effort normal and breath  sounds normal. No respiratory distress.  Abdominal: Soft. Bowel sounds are normal. She exhibits no distension.  Colostomy with pink stoma, no surrounding redness or drainage.   Musculoskeletal: She exhibits no edema or tenderness.  Lymphadenopathy:    She has no cervical adenopathy.  Neurological: She is alert and oriented to person, place, and time.  Skin: Skin is warm and dry. She is not diaphoretic.  Skin tenting. Midline incision with staples, CDI  Psychiatric:  flat    Labs reviewed:  Recent Labs  03/12/17 1303  03/13/17 1048 03/13/17 1329 03/14/17 0451 03/14/17 1259 03/14/17 1447 03/15/17 0347  NA 140  < > 142  --  145  --   --  143  K 3.3*  < > 2.7*  --  3.2*  --   --  3.3*  CL 115*  < > 117*  --  119*  --   --  121*  CO2 19*  < > 20*  --  16*  --   --  14*  GLUCOSE 131*  < > 145*  --  122*  --   --  120*  BUN 32*  < > 30*  --  30*  --   --  28*  CREATININE 1.27*  < >  1.19*  --  0.98  --   --  0.92  CALCIUM 8.1*  < > 8.7*  --  8.7*  --   --  8.4*  MG 1.8  --   --  1.7  --  QUANTITY NOT SUFFICIENT, UNABLE TO PERFORM TEST 2.2  --   PHOS 2.8  --   --   --   --   --   --   --   < > = values in this interval not displayed.  Recent Labs  03/04/17 1822 03/13/17 0508 03/14/17 1259  AST 24 84* 98*  ALT 11* 126* 157*  ALKPHOS 93 138* 121  BILITOT 1.3* 0.9 0.9  PROT 7.3 4.8* 4.8*  ALBUMIN 4.1 1.9* 1.7*    Recent Labs  03/07/17 1733  03/11/17 1012 03/13/17 0508 03/14/17 0451  WBC 5.8  < > 6.4 14.5* 11.9*  NEUTROABS 5.0  --  5.2  --   --   HGB 12.9  < > 13.6 14.7 15.0  HCT 39.2  < > 43.1 46.0 44.4  MCV 89.3  < > 91.3 90.0 87.2  PLT 283  < > 199 212 178  < > = values in this interval not displayed. Lab Results  Component Value Date   TSH 2.117 03/12/2017   No results found for: HGBA1C No results found for: CHOL, HDL, LDLCALC, LDLDIRECT, TRIG, CHOLHDL  Significant Diagnostic Results in last 30 days:  Ct Abdomen Pelvis W Contrast  Result Date: 03/07/2017 CLINICAL DATA:  Abdominal distension and lower abdominal pain for 2 weeks. History of chronic kidney disease. EXAM: CT ABDOMEN AND PELVIS WITH CONTRAST TECHNIQUE: Multidetector CT imaging of the abdomen and pelvis was performed using the standard protocol following bolus administration of intravenous contrast. CONTRAST:  137mL ISOVUE-300 IOPAMIDOL (ISOVUE-300) INJECTION 61% COMPARISON:  CT abdomen and pelvis March 04, 2017 FINDINGS: LOWER CHEST: Lung bases are clear. Included heart size is normal. No pericardial effusion. HEPATOBILIARY: Vicarious excretion of contrast in the gallbladder which is mildly distended, otherwise unremarkable. Mild periportal edema, liver is otherwise normal. PANCREAS: Re- demonstration of 14 mm lobular cystic mass in uncinate of pancreas. Given patient's age of greater than 54 years old, recommend follow-up CT in  2 years. SPLEEN: 14 mm benign-appearing cyst within the spleen,  unchanged. ADRENALS/URINARY TRACT: Kidneys are orthotopic, demonstrating symmetric enhancement. No nephrolithiasis, hydronephrosis or solid renal masses. The unopacified ureters are normal in course and caliber. Delayed imaging through the kidneys demonstrates symmetric prompt contrast excretion within the proximal urinary collecting system. Urinary bladder is partially distended and unremarkable. Normal adrenal glands. STOMACH/BOWEL: Large amount of retained large bowel stool with transition point in the sigmoid colon associated with wall thickening, potential mass. Limited assessment due to streak artifact from hip arthroplasties. Small bowel feces and small and large bowel air-fluid levels. Probable gastric antral diverticulum. VASCULAR/LYMPHATIC: Aortoiliac vessels are normal in course and caliber. Mild aortic atherosclerosis. No lymphadenopathy by CT size criteria. REPRODUCTIVE: Status post hysterectomy. OTHER: Small volume ascites. No intraperitoneal free air or drainable fluid collection. MUSCULOSKELETAL: Nonacute. Streak artifact from bilateral hip total arthroplasties. Grade 1 L4-5 grade 1 L5-S1 anterolisthesis on degenerative basis. IMPRESSION: 1. Persistent distal large bowel obstruction with transition point in sigmoid colon concerning for neoplasm, less likely stricture. Recommend colonoscopy. Large volume retained large bowel stool. 2. Increasing small volume ascites. 3. Re- demonstration of 14 mm pancreatic cystic mass. Considering patient's age of greater than 86, recommend follow-up CT in 2 years. This recommendation follows ACR consensus guidelines: Management of Incidental Pancreatic Cysts: A White Paper of the ACR Incidental Findings Committee. [place] [id number]. Aortic Atherosclerosis (ICD10-I70.0). Electronically Signed   By: Elon Alas M.D.   On: 03/07/2017 19:09   Dg Abd Acute W/chest  Result Date: 03/07/2017 CLINICAL DATA:  Vomiting for several hours EXAM: DG  ABDOMEN ACUTE W/ 1V CHEST COMPARISON:  None. FINDINGS: Cardiac shadow is within normal limits. The lungs are well aerated bilaterally. Mild aortic calcifications are seen. Scattered large and small bowel gas is noted. Mild small bowel dilatation is noted consistent with at least a partial small bowel obstruction. These changes are new from the prior exam. Degenerative changes of lumbar spine are seen. Bilateral hip replacements are noted. IMPRESSION: Ages consistent with ileus partial small bowel obstruction. Electronically Signed   By: Inez Catalina M.D.   On: 03/07/2017 17:21   Dg Colon W/cm Ltd  Result Date: 03/08/2017 CLINICAL DATA:  83 year old female with large bowel obstruction. Unable to advance scope beyond the rectosigmoid junction today. EXAM: BE LIMITED WITH CONTRAST CONTRAST:  Isovue 300 was administered rectally. FLUOROSCOPY TIME:  Fluoroscopy Time:  1 minutes 36 seconds Number of Acquired Spot Images: 14 COMPARISON:  CT 03/07/2017 FINDINGS: Scout image demonstrates marked air-filled dilatation dilatation of both large and small bowel. There air-fluid levels on the lateral decubitus view. Note is made of contrast opacification within the bladder. There are bilateral hip replacements. Contrast is seen filling the lumen of the rectum to the level of the rectosigmoid junction. Contrast material did not flow beyond this area, despite multiple attempts at repositioning the patient. This corresponds with the areas of obstruction identified on recent CT scan. There is no evidence for perforation or leakage of contrast material. The contrast material was subsequently decompressed. The patient tolerated the procedure without difficulty. IMPRESSION: High-grade obstruction at the rectosigmoid junction without passage of contrast proximally. This corresponds with the area of obstruction identified on recent CT scan. Differential includes obstructing mass, and less likely high-grade stricture at this location.  Electronically Signed   By: Kristopher Oppenheim M.D.   On: 03/08/2017 17:35    Assessment/Plan  1. Complete intestinal obstruction, unspecified cause ([place]) S/p exp lap wth colostomy  2.  S/P exploratory laparotomy Tolerating solid food but poor intake due to lack of appetite No pain.  Moving bowels.  She looks dry on exam despite lower ext edema.  I have asked the staff to encourage oral intake and we will check labs in the am IS QID 10 breaths She is to f/u with surgery on 9/11, staples to remain in place until her apt PT/OT eval and treat   3. Colostomy status [facility]) The staff will work with Ms. Guest to educate her regarding the care of a colostomy.  She is rejecting doing this at this time.  4. 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  She is slightly confused this morning and did not remember the details of her care at the hospital.  We will plan to discuss with her further her goals of care in conjunction with the surgeon.  Hopefully she will clear mentally and be able to participate more in her care.   5. Atrial fibrillation with RVR ([facility]) Rate controlled with Cardizem and metoprolol Continue Eliquis 5 mg BID  6. Cerebrovascular disease H/O CVA, now on eliquis Given her current health situation, no lipid panel was ordered  7. Severe protein-calorie malnutrition ([place]) She does not like ensure liquid but the staff were able to get her to take ensure pudding to help with this situation which is contributing to her edema.  8. Localized edema Not able to wear compression hose at this time due to blisters Keep feet elevated No s/s of CHF, does have low albumin   9. Pancreatic cyst F/U CT scan recommended in 2 years, but I do not think she will want to pursue further testing  10. Transaminitis Unclear etiology Recheck in 2 weeks   Family/  staff Communication: discussed with resident  Labs/tests ordered:  CBC BMP in am

## 2017-03-19 ENCOUNTER — Encounter: Payer: Self-pay | Admitting: Adult Health

## 2017-03-19 ENCOUNTER — Non-Acute Institutional Stay (SKILLED_NURSING_FACILITY): Payer: Medicare Other | Admitting: Adult Health

## 2017-03-19 DIAGNOSIS — E860 Dehydration: Secondary | ICD-10-CM | POA: Diagnosis not present

## 2017-03-19 DIAGNOSIS — I959 Hypotension, unspecified: Secondary | ICD-10-CM

## 2017-03-19 DIAGNOSIS — D649 Anemia, unspecified: Secondary | ICD-10-CM | POA: Diagnosis not present

## 2017-03-19 DIAGNOSIS — R600 Localized edema: Secondary | ICD-10-CM

## 2017-03-19 DIAGNOSIS — C482 Malignant neoplasm of peritoneum, unspecified: Secondary | ICD-10-CM

## 2017-03-19 DIAGNOSIS — I480 Paroxysmal atrial fibrillation: Secondary | ICD-10-CM

## 2017-03-19 DIAGNOSIS — D72829 Elevated white blood cell count, unspecified: Secondary | ICD-10-CM | POA: Diagnosis not present

## 2017-03-19 LAB — BASIC METABOLIC PANEL
BUN: 24 — AB (ref 4–21)
Creatinine: 0.8 (ref 0.5–1.1)
Glucose: 96
Potassium: 3.1 — AB (ref 3.4–5.3)
Sodium: 140 (ref 137–147)

## 2017-03-19 LAB — CBC AND DIFFERENTIAL
HCT: 38 (ref 36–46)
Hemoglobin: 12.4 (ref 12.0–16.0)
Platelets: 354 (ref 150–399)
WBC: 21.3

## 2017-03-19 NOTE — Progress Notes (Signed)
Location:  [facility]up[facility] psychologist of Service:  SNF (31) Provider:   Cindi Carbon, [place] [phone]   Katherine Contras, MD  Patient Care Team: Katherine Contras, MD as PCP - General (Family Medicine) Gayland Curry, DO as Consulting Physician (Geriatric Medicine)  Extended Emergency Contact Information Primary Emergency Contact: Doyle,Katherine Address: [address] [place] Phone: [phone] Work Phone: [phone] Mobile Phone: [phone] Relation: Daughter  Code Status:  DNR Goals of care: Advanced Directive information Advanced Directives 03/19/2017  Does Patient Have a Medical Advance Directive? Yes  Type of Advance Directive Living will;Healthcare Power of Attorney  Does patient want to make changes to medical advance directive? Yes (MAU/Ambulatory/Procedural Areas - Information given)  Copy of [place] in Chart? Yes  Pre-existing out of facility DNR order (yellow form or pink MOST form) -     Chief Complaint  Patient presents with  . Acute Visit    low BP    HPI:  Pt is a 83 y.o. female seen today for an acute visit for low BP of 84/56, later 80/50.  [name] is here in rehab after an exp lap and colostomy. She most likely has metastatic ca from a gynecologic source per the path report. (see note on 9/6).   She has been drinking fluid but not eating well. Staff report that she did not urinate last night and only urinate once yesterday.   BMP and CBC labs are pending. She reports feeling weak and tired. There is increasing edema in her legs and arms with weeping. No SOB or CP. Her albumin is low at 1.7.   Currently she is on Cardizem 120 mg and lopressor BID for afib. HR 86.     Past Medical History:  Diagnosis Date  . Anxiety   . Arthritis   . Chronic kidney disease   . Constipation   . Movement disorder   . Sinus drainage   . Stroke [facility])    hx of  ministroke in 2011   . Vision abnormalities    Past Surgical History:  Procedure Laterality Date  . COLECTOMY WITH COLOSTOMY CREATION/HARTMANN PROCEDURE N/A 03/09/2017   Procedure: EXPLORATORY LAPAROTOMY WITH FROZEN SECTION;  Surgeon: Judeth Horn, MD;  Location: [facility];  Service: General;  Laterality: N/A;  . COLOSTOMY  03/09/2017   Procedure: LOOP COLOSTOMY;  Surgeon: Judeth Horn, MD;  Location: [place];  Service: General;;  . EYE SURGERY     bilateral cataract surgery   . FLEXIBLE SIGMOIDOSCOPY N/A 03/08/2017   Procedure: FLEXIBLE SIGMOIDOSCOPY;  Surgeon: Otis Brace, MD;  Location: [facility];  Service: Gastroenterology;  Laterality: N/A;  . JOINT REPLACEMENT     right hip   . TOTAL HIP ARTHROPLASTY  03/08/2012   Procedure: TOTAL HIP ARTHROPLASTY ANTERIOR APPROACH;  Surgeon: Mauri Pole, MD;  Location: [facility];  Service: Orthopedics;  Laterality: Left;  . tumor removed right breast      benign     No Known Allergies  Outpatient Encounter Prescriptions as of 03/19/2017  Medication Sig  . apixaban (ELIQUIS) 5 MG TABS tablet Take 1 tablet (5 mg total) by mouth 2 (two) times daily.  . Cholecalciferol (VITAMIN D) 2000 UNITS tablet Take 2,000 Units by mouth every morning.  . diltiazem (CARDIZEM CD) 120 MG 24 hr capsule Take 1 capsule (120 mg total) by mouth daily.  . feeding supplement,  ENSURE ENLIVE, (ENSURE ENLIVE) LIQD Take 237 mLs by mouth 2 (two) times daily between meals.   No facility-administered encounter medications on file as of 03/19/2017.     Review of Systems  Constitutional: Positive for activity change, appetite change and fatigue. Negative for chills, diaphoresis, fever and unexpected weight change.  HENT: Negative for congestion.   Eyes: Negative for visual disturbance.  Respiratory: Negative for cough and shortness of breath.   Cardiovascular: Positive for leg swelling.  Gastrointestinal: Negative for constipation, diarrhea and vomiting.       Feels bloated    Genitourinary: Positive for decreased urine volume. Negative for difficulty urinating and dysuria.  Neurological: Positive for weakness.  Psychiatric/Behavioral: Negative for agitation, behavioral problems and confusion.     There is no immunization history on file for this patient. Pertinent  Health Maintenance Due  Topic Date Due  . DEXA SCAN  02/12/1991  . PNA vac Low Risk Adult (1 of 2 - PCV13) 02/12/1991  . INFLUENZA VACCINE  02/10/2017   No flowsheet data found. Functional Status Survey:    Vitals:   03/19/17 1032  BP: (!) 84/56  Pulse: 84  Resp: 20  Temp: (!) 96.5 F (35.8 C)  SpO2: 93%   There is no height or weight on file to calculate BMI. Physical Exam  Constitutional: She is oriented to person, place, and time. No distress.  Neck: No JVD present.  Cardiovascular: Normal rate.   No murmur heard. Irregular. BLE edema +2-3 and edema to both elbows. Weeping to both legs  Pulmonary/Chest: Effort normal and breath sounds normal. No respiratory distress.  Abdominal: Soft. Bowel sounds are normal. She exhibits no distension. There is no tenderness.  Neurological: She is alert and oriented to person, place, and time.  Skin: Skin is warm and dry. She is not diaphoretic.  Sacrum with 100% pink tissue that blanches, two small areas of dark discoloration noted    Labs reviewed:  Recent Labs  03/12/17 1303  03/13/17 1048 03/13/17 1329 03/14/17 0451 03/14/17 1259 03/14/17 1447 03/15/17 0347  NA 140  < > 142  --  145  --   --  143  K 3.3*  < > 2.7*  --  3.2*  --   --  3.3*  CL 115*  < > 117*  --  119*  --   --  121*  CO2 19*  < > 20*  --  16*  --   --  14*  GLUCOSE 131*  < > 145*  --  122*  --   --  120*  BUN 32*  < > 30*  --  30*  --   --  28*  CREATININE 1.27*  < > 1.19*  --  0.98  --   --  0.92  CALCIUM 8.1*  < > 8.7*  --  8.7*  --   --  8.4*  MG 1.8  --   --  1.7  --  QUANTITY NOT SUFFICIENT, UNABLE TO PERFORM TEST 2.2  --   PHOS 2.8  --   --   --   --    --   --   --   < > = values in this interval not displayed.  Recent Labs  03/04/17 1822 03/13/17 0508 03/14/17 1259  AST 24 84* 98*  ALT 11* 126* 157*  ALKPHOS 93 138* 121  BILITOT 1.3* 0.9 0.9  PROT 7.3 4.8* 4.8*  ALBUMIN 4.1 1.9* 1.7*    Recent  Labs  03/07/17 1733  03/11/17 1012 03/13/17 0508 03/14/17 0451  WBC 5.8  < > 6.4 14.5* 11.9*  NEUTROABS 5.0  --  5.2  --   --   HGB 12.9  < > 13.6 14.7 15.0  HCT 39.2  < > 43.1 46.0 44.4  MCV 89.3  < > 91.3 90.0 87.2  PLT 283  < > 199 212 178  < > = values in this interval not displayed. Lab Results  Component Value Date   TSH 2.117 03/12/2017   No results found for: HGBA1C No results found for: CHOL, HDL, LDLCALC, LDLDIRECT, TRIG, CHOLHDL  Significant Diagnostic Results in last 30 days:  Ct Abdomen Pelvis W Contrast  Result Date: 03/07/2017 CLINICAL DATA:  Abdominal distension and lower abdominal pain for 2 weeks. History of chronic kidney disease. EXAM: CT ABDOMEN AND PELVIS WITH CONTRAST TECHNIQUE: Multidetector CT imaging of the abdomen and pelvis was performed using the standard protocol following bolus administration of intravenous contrast. CONTRAST:  174mL ISOVUE-300 IOPAMIDOL (ISOVUE-300) INJECTION 61% COMPARISON:  CT abdomen and pelvis March 04, 2017 FINDINGS: LOWER CHEST: Lung bases are clear. Included heart size is normal. No pericardial effusion. HEPATOBILIARY: Vicarious excretion of contrast in the gallbladder which is mildly distended, otherwise unremarkable. Mild periportal edema, liver is otherwise normal. PANCREAS: Re- demonstration of 14 mm lobular cystic mass in uncinate of pancreas. Given patient's age of greater than 70 years old, recommend follow-up CT in 2 years. SPLEEN: 14 mm benign-appearing cyst within the spleen, unchanged. ADRENALS/URINARY TRACT: Kidneys are orthotopic, demonstrating symmetric enhancement. No nephrolithiasis, hydronephrosis or solid renal masses. The unopacified ureters are normal in  course and caliber. Delayed imaging through the kidneys demonstrates symmetric prompt contrast excretion within the proximal urinary collecting system. Urinary bladder is partially distended and unremarkable. Normal adrenal glands. STOMACH/BOWEL: Large amount of retained large bowel stool with transition point in the sigmoid colon associated with wall thickening, potential mass. Limited assessment due to streak artifact from hip arthroplasties. Small bowel feces and small and large bowel air-fluid levels. Probable gastric antral diverticulum. VASCULAR/LYMPHATIC: Aortoiliac vessels are normal in course and caliber. Mild aortic atherosclerosis. No lymphadenopathy by CT size criteria. REPRODUCTIVE: Status post hysterectomy. OTHER: Small volume ascites. No intraperitoneal free air or drainable fluid collection. MUSCULOSKELETAL: Nonacute. Streak artifact from bilateral hip total arthroplasties. Grade 1 L4-5 grade 1 L5-S1 anterolisthesis on degenerative basis. IMPRESSION: 1. Persistent distal large bowel obstruction with transition point in sigmoid colon concerning for neoplasm, less likely stricture. Recommend colonoscopy. Large volume retained large bowel stool. 2. Increasing small volume ascites. 3. Re- demonstration of 14 mm pancreatic cystic mass. Considering patient's age of greater than 8, recommend follow-up CT in 2 years. This recommendation follows ACR consensus guidelines: Management of Incidental Pancreatic Cysts: A White Paper of the ACR Incidental Findings Committee. [place] 1950;93:[id number]. Aortic Atherosclerosis (ICD10-I70.0). Electronically Signed   By: Elon Alas M.D.   On: 03/07/2017 19:09   Ct Abdomen Pelvis W Contrast  Result Date: 03/05/2017 CLINICAL DATA:  83 year old with abdominal pain and distension. EXAM: CT ABDOMEN AND PELVIS WITH CONTRAST TECHNIQUE: Multidetector CT imaging of the abdomen and pelvis was performed using the standard protocol following bolus administration  of intravenous contrast. CONTRAST:  75 cc Isovue 300 IV COMPARISON:  Radiograph 02/01/2017. FINDINGS: Lower chest: Linear atelectasis in the left lower lobe. No confluent consolidation. No pleural effusion. Hepatobiliary: No focal hepatic lesion. The gallbladder is physiologically distended, displaced anteriorly without gallbladder inflammation. There is mild intrahepatic biliary  ductal dilatation. Common bile duct is not well-defined. Pancreas: Parenchymal atrophy. No ductal dilatation or inflammation. There is a 1.3 x 1.1 cm cystic structure adjacent to the uncinate process of pancreas. Spleen: Small inside with irregular contours. Ill-defined low-density lesions in the lower spleen. Minimal adjacent fluid/ edema. Adrenals/Urinary Tract: No adrenal nodule. No hydronephrosis or perinephric edema. Homogeneous enhancement with symmetric excretion on delayed phase imaging. Urinary bladder is obscured by streak artifact from bilateral hip prostheses. Stomach/Bowel: Marked colonic stool burden with large volume of stool in the ascending, transverse, descending and proximal sigmoid colon with associated tortuosity. Probable transition in the mid sigmoid image 61 series 2 with possible wall thickening. The distal most sigmoid colon appears decompressed, however are partially obscured by streak artifact from adjacent hip prosthesis. No evidence of volvulus. No small bowel dilatation. There is mild fecalization of distal small bowel contents in keeping with slow transit. Appendix is not visualized. Vascular/Lymphatic: Mild aortic atherosclerosis tortuosity. No definite abdominal or pelvic adenopathy we common pelvis partially obscured by streak artifact. Reproductive: Pelvis and adnexa are obscured by streak artifact. Uterus not definitively seen. Other: No abdominal ascites, cannot assess for pelvic ascites. No free air. Musculoskeletal: Multilevel degenerative change in the lumbar spine, degenerative type anterolisthesis  of L4 on L5 and L5 on S1. Bilateral hip arthroplasties. No focal bone lesion. IMPRESSION: 1. Very large stool burden and colonic tortuosity resulting in abdominal distension. Transition in the mid sigmoid in the regional possible wall thickening, distal most sigmoid colon is decompressed. This may be due to stricture, neoplasm, less likely peristalsis. Recommend direct visualization with colonoscopy. 2. No small bowel dilatation or obstruction. 3. Small size spleen with irregular contours, low-density lesions inferiorly. There is also mild adjacent edema/stranding. No prior exams available for comparison. Splenic lesions are typically benign, however if colonic neoplasm is demonstrated metastatic disease is not excluded. Alternatively this may be sequela of prior splenic infarct. 4. A 1.3 cm cystic lesion adjacent to the uncinate process of pancreas. Given patient's age (greater than 38 years) recommend follow-up CT in 2 years. 5. Mild intrahepatic biliary ductal dilatation without identifiable cause. No abnormal gallbladder distention. 6.  Aortic Atherosclerosis (ICD10-I70.0). Electronically Signed   By: Jeb Levering M.D.   On: 03/05/2017 00:29   Dg Chest Port 1 View  Result Date: 03/14/2017 CLINICAL DATA:  Increased cough. EXAM: PORTABLE CHEST 1 VIEW COMPARISON:  03/07/2017. FINDINGS: Normal sized heart. Interval dense left lower lobe airspace opacity. Poor inspiration with minimal bibasilar atelectasis. Diffuse osteopenia. IMPRESSION: 1. Dense left lower lobe atelectasis or pneumonia. 2. Poor inspiration with minimal bibasilar atelectasis. Electronically Signed   By: Claudie Revering M.D.   On: 03/14/2017 13:36   Dg Abd Acute W/chest  Result Date: 03/07/2017 CLINICAL DATA:  Vomiting for several hours EXAM: DG ABDOMEN ACUTE W/ 1V CHEST COMPARISON:  None. FINDINGS: Cardiac shadow is within normal limits. The lungs are well aerated bilaterally. Mild aortic calcifications are seen. Scattered large and small  bowel gas is noted. Mild small bowel dilatation is noted consistent with at least a partial small bowel obstruction. These changes are new from the prior exam. Degenerative changes of lumbar spine are seen. Bilateral hip replacements are noted. IMPRESSION: Ages consistent with ileus partial small bowel obstruction. Electronically Signed   By: Inez Catalina M.D.   On: 03/07/2017 17:21   Dg Colon W/cm Ltd  Result Date: 03/08/2017 CLINICAL DATA:  83 year old female with large bowel obstruction. Unable to advance scope beyond the rectosigmoid junction today.  EXAM: BE LIMITED WITH CONTRAST CONTRAST:  Isovue 300 was administered rectally. FLUOROSCOPY TIME:  Fluoroscopy Time:  1 minutes 36 seconds Number of Acquired Spot Images: 14 COMPARISON:  CT 03/07/2017 FINDINGS: Scout image demonstrates marked air-filled dilatation dilatation of both large and small bowel. There air-fluid levels on the lateral decubitus view. Note is made of contrast opacification within the bladder. There are bilateral hip replacements. Contrast is seen filling the lumen of the rectum to the level of the rectosigmoid junction. Contrast material did not flow beyond this area, despite multiple attempts at repositioning the patient. This corresponds with the areas of obstruction identified on recent CT scan. There is no evidence for perforation or leakage of contrast material. The contrast material was subsequently decompressed. The patient tolerated the procedure without difficulty. IMPRESSION: High-grade obstruction at the rectosigmoid junction without passage of contrast proximally. This corresponds with the area of obstruction identified on recent CT scan. Differential includes obstructing mass, and less likely high-grade stricture at this location. Electronically Signed   By: Kristopher Oppenheim M.D.   On: 03/08/2017 17:35    Assessment/Plan  1) Hypotension With decreased urine output NS at 80 cc/hr for 1 L only (see below). Monitor for SOB,  decreased oxygen sats Remain in bed, no PT today  2) Afib Hold metoprolol as previously ordered for systolic BP <41. Continue Cardizem to help control HR and monitor VS  3) Metastatic carcinoma of the Peritoneum [name] is clear that she does not want any aggressive measures. She has not heard the results of the path report but has a sense that she may die by her account and she is fine with that. She is meeting to talk with the surgeon to go over these issues but already knows that she will not pursue treatment. We discussed a most form and she indicated that she would not want to be hospitalized under any circumstances.  She would like to try 1 bag of fluid to help with the weakness, fatigue, and low BP.    4) Edema Due to #3 and low albumin Wrap legs with kerlix due to weeping Weight 3 x weekly  Family/ staff Communication: discussed with resident and her daughter and they are in agreement with the above plan   Labs/tests ordered:  Labs pending

## 2017-03-22 DIAGNOSIS — R2689 Other abnormalities of gait and mobility: Secondary | ICD-10-CM | POA: Diagnosis not present

## 2017-03-22 DIAGNOSIS — M6389 Disorders of muscle in diseases classified elsewhere, multiple sites: Secondary | ICD-10-CM | POA: Diagnosis not present

## 2017-03-22 DIAGNOSIS — M62562 Muscle wasting and atrophy, not elsewhere classified, left lower leg: Secondary | ICD-10-CM | POA: Diagnosis not present

## 2017-03-22 DIAGNOSIS — R600 Localized edema: Secondary | ICD-10-CM | POA: Diagnosis not present

## 2017-03-22 DIAGNOSIS — R278 Other lack of coordination: Secondary | ICD-10-CM | POA: Diagnosis not present

## 2017-03-22 DIAGNOSIS — M62561 Muscle wasting and atrophy, not elsewhere classified, right lower leg: Secondary | ICD-10-CM | POA: Diagnosis not present

## 2017-03-23 ENCOUNTER — Non-Acute Institutional Stay (SKILLED_NURSING_FACILITY): Payer: Medicare Other | Admitting: Internal Medicine

## 2017-03-23 ENCOUNTER — Encounter: Payer: Self-pay | Admitting: Internal Medicine

## 2017-03-23 DIAGNOSIS — M62561 Muscle wasting and atrophy, not elsewhere classified, right lower leg: Secondary | ICD-10-CM | POA: Diagnosis not present

## 2017-03-23 DIAGNOSIS — I481 Persistent atrial fibrillation: Secondary | ICD-10-CM

## 2017-03-23 DIAGNOSIS — R600 Localized edema: Secondary | ICD-10-CM | POA: Diagnosis not present

## 2017-03-23 DIAGNOSIS — R601 Generalized edema: Secondary | ICD-10-CM | POA: Diagnosis not present

## 2017-03-23 DIAGNOSIS — Z7189 Other specified counseling: Secondary | ICD-10-CM

## 2017-03-23 DIAGNOSIS — Z9889 Other specified postprocedural states: Secondary | ICD-10-CM

## 2017-03-23 DIAGNOSIS — C786 Secondary malignant neoplasm of retroperitoneum and peritoneum: Secondary | ICD-10-CM

## 2017-03-23 DIAGNOSIS — R7401 Elevation of levels of liver transaminase levels: Secondary | ICD-10-CM

## 2017-03-23 DIAGNOSIS — K56609 Unspecified intestinal obstruction, unspecified as to partial versus complete obstruction: Secondary | ICD-10-CM

## 2017-03-23 DIAGNOSIS — R2689 Other abnormalities of gait and mobility: Secondary | ICD-10-CM | POA: Diagnosis not present

## 2017-03-23 DIAGNOSIS — Z933 Colostomy status: Secondary | ICD-10-CM | POA: Diagnosis not present

## 2017-03-23 DIAGNOSIS — R740 Nonspecific elevation of levels of transaminase and lactic acid dehydrogenase [LDH]: Secondary | ICD-10-CM | POA: Diagnosis not present

## 2017-03-23 DIAGNOSIS — R278 Other lack of coordination: Secondary | ICD-10-CM | POA: Diagnosis not present

## 2017-03-23 DIAGNOSIS — I4819 Other persistent atrial fibrillation: Secondary | ICD-10-CM

## 2017-03-23 DIAGNOSIS — M62562 Muscle wasting and atrophy, not elsewhere classified, left lower leg: Secondary | ICD-10-CM | POA: Diagnosis not present

## 2017-03-23 DIAGNOSIS — M6389 Disorders of muscle in diseases classified elsewhere, multiple sites: Secondary | ICD-10-CM | POA: Diagnosis not present

## 2017-03-23 NOTE — Progress Notes (Signed)
Patient ID: [name], female   DOB: 1923-08-01, 83 y.o.   MRN: [id number]  Provider:  Rexene Edison. Mariea Clonts, D.O., C.M.D. Location:  [facility] Room Number: 154 rehab Place of Service:  SNF (31)  PCP: Antony Contras, MD Patient Care Team: Antony Contras, MD as PCP - General (Family Medicine) Gayland Curry, DO as Consulting Physician (Geriatric Medicine)  Extended Emergency Contact Information Primary Emergency Contact: Hyder,Sharon Address: [address] [place] Phone: [phone] Work Phone: [phone] Mobile Phone: [phone] Relation: Daughter  Code Status: DNR Goals of Care: Advanced Directive information Advanced Directives 03/23/2017  Does Patient Have a Medical Advance Directive? Yes  Type of Advance Directive Out of facility DNR (pink MOST or yellow form);Living will;Healthcare Power of Attorney  Does patient want to make changes to medical advance directive? -  Copy of [place] in Chart? Yes  Pre-existing out of facility DNR order (yellow form or pink MOST form) Yellow form placed in chart (order not valid for inpatient use);Pink MOST form placed in chart (order not valid for inpatient use)   Chief Complaint  Patient presents with  . New Admit To SNF    Rehab admission    HPI: Patient is a 83 y.o. female seen today for admission to [facility] re[facility] after hospitalization 8/26-9/4 with abdominal pain.  She'd been seeing GI and there were plans for an outpatient cscope, but pain became severe on the left side of her abdomen and she was taken to the ED.  She was found to have a high grade rectosigmoid obstruction with colon mass.  Exploratory laparotomy with frozen section and loop colostomy was done 8/28 after flex sig showed external hemorrhoids, pediatric cscope and egd could not be advanced through that area.  Pathology of the colon mass was negative for cancer, but  peritoneal frozen section was positive for metastatic cancer likely of gynecologic primary. Transaminases have been elevated also.  Since she's been here, she's had poor appetite, felt very weak and lacks energy.  She's not been eating much and boost supplements worsened her nausea.  She is agreeable to trying other comfort food items like jello.  She had agreed to PT, OT, but has not had the energy to participate and tells me she does not want to do it b/c there is no point.  She is aware that there is not a cure for her cancer at this point and she feels ready to die.  Her daughter, [name], was present for the visit also and concurs that her mother is very clear in her wishes and she and pt's grandson will not stand in the way of this choice.  Pt does not have the energy to get up to a chair or to go out for suture removal and follow-up with surgery.  She asked if the sutures could be removed here.    We reviewed her understanding of the disease and I explained the pathology report to her and how a gyn cancer could cause her presentation.  She asked several questions, but expressed understanding.  She is unhappy about having her colostomy.  She realizes that she is near end of life due to her poor appetite. She's also dealing with quite a bit of edema from malnutrition and anasarca due to the malignancy.  Staff are positioning her to help with the edema. Her legs are wrapped due to drainage.  She  has some discomfort in the left side of her abdomen with movement, but is ok at rest w/o any pain.  She is not short of breath. Her colostomy has had output and flatus.  She's been afebrile.  We discussed her goals which she mentions are to be comfortable and then to die.  I mentioned hospice care.  She said she knows what it is but requested my explanation.  She and her daughter were quite open to the idea.  MOST form completed by NP yesterday was reviewed.  Pt continues to desire no hospitalizations, no aggressive  interventions, no tube feeding and no antibiotics.  She was agreeable to some fluids one time.    Past Medical History:  Diagnosis Date  . Anxiety   . Arthritis   . Chronic kidney disease   . Constipation   . Movement disorder   . Sinus drainage   . Stroke [facility])    hx of ministroke in 2011   . Vision abnormalities    Past Surgical History:  Procedure Laterality Date  . COLECTOMY WITH COLOSTOMY CREATION/HARTMANN PROCEDURE N/A 03/09/2017   Procedure: EXPLORATORY LAPAROTOMY WITH FROZEN SECTION;  Surgeon: Jimmye Norman, MD;  Location: [facility] OR;  Service: General;  Laterality: N/A;  . COLOSTOMY  03/09/2017   Procedure: LOOP COLOSTOMY;  Surgeon: Jimmye Norman, MD;  Location: [facility] OR;  Service: General;;  . EYE SURGERY     bilateral cataract surgery   . FLEXIBLE SIGMOIDOSCOPY N/A 03/08/2017   Procedure: FLEXIBLE SIGMOIDOSCOPY;  Surgeon: Kathi Der, MD;  Location: [facility] ENDOSCOPY;  Service: Gastroenterology;  Laterality: N/A;  . JOINT REPLACEMENT     right hip   . TOTAL HIP ARTHROPLASTY  03/08/2012   Procedure: TOTAL HIP ARTHROPLASTY ANTERIOR APPROACH;  Surgeon: Shelda Pal, MD;  Location: [facility] ORS;  Service: Orthopedics;  Laterality: Left;  . tumor removed right breast      benign     reports that she has never smoked. She has never used smokeless tobacco. She reports that she does not use drugs. Her alcohol history is not on file. Social History   Social History  . Marital status: Widowed    Spouse name: N/A  . Number of children: N/A  . Years of education: N/A   Occupational History  . Not on file.   Social History Main Topics  . Smoking status: Never Smoker  . Smokeless tobacco: Never Used  . Alcohol use Not on file  . Drug use: No  . Sexual activity: Not on file   Other Topics Concern  . Not on file   Social History Narrative  . No narrative on file    Functional Status Survey:  requiring assistance with all ADLs due to weakness, mostly bedbound now  Palliative  performance scale about 40% and declining  Family History  Problem Relation Age of Onset  . Heart disease Mother   . Dementia Mother   . COPD Father     Health Maintenance  Topic Date Due  . TETANUS/TDAP  02/11/1945  . DEXA SCAN  02/12/1991  . PNA vac Low Risk Adult (1 of 2 - PCV13) 02/12/1991  . INFLUENZA VACCINE  02/10/2017    No Known Allergies  Outpatient Encounter Prescriptions as of 03/23/2017  Medication Sig  . apixaban (ELIQUIS) 5 MG TABS tablet Take 1 tablet (5 mg total) by mouth 2 (two) times daily.  . Cholecalciferol (VITAMIN D) 2000 UNITS tablet Take 2,000 Units by mouth every morning.  . diltiazem (CARDIZEM  CD) 120 MG 24 hr capsule Take 1 capsule (120 mg total) by mouth daily.  . feeding supplement, ENSURE ENLIVE, (ENSURE ENLIVE) LIQD Take 237 mLs by mouth 2 (two) times daily between meals.  . metoprolol tartrate (LOPRESSOR) 25 MG tablet Take 12.5 mg by mouth 2 (two) times daily.  . Morphine Sulfate (MORPHINE CONCENTRATE) 10 mg / 0.5 ml concentrated solution Take 5 mg by mouth every 6 (six) hours as needed for severe pain or shortness of breath.  . ondansetron (ZOFRAN) 4 MG tablet Take 4 mg by mouth every 8 (eight) hours as needed for nausea.  . ondansetron (ZOFRAN) 4 MG/2ML SOLN injection Inject 4 mg into the vein every 8 (eight) hours as needed for nausea or vomiting.   No facility-administered encounter medications on file as of 03/23/2017.     Review of Systems  Constitutional: Positive for activity change, appetite change and fatigue. Negative for chills and fever.  HENT: Positive for hearing loss. Negative for congestion.   Eyes: Negative for visual disturbance.  Respiratory: Negative for chest tightness and shortness of breath.   Cardiovascular: Negative for chest pain and palpitations.  Gastrointestinal: Positive for abdominal distention and abdominal pain. Negative for anal bleeding, blood in stool, constipation, nausea and vomiting.       Colostomy with  light brown stools  Genitourinary: Positive for dyspareunia. Negative for dysuria and urgency.  Musculoskeletal: Negative for arthralgias.  Skin: Positive for color change.  Allergic/Immunologic: Positive for immunocompromised state.  Neurological: Positive for weakness. Negative for dizziness.  Hematological: Bruises/bleeds easily.  Psychiatric/Behavioral: Positive for confusion. Negative for behavioral problems and sleep disturbance. The patient is not nervous/anxious.        Slight confusion, short term memory loss    Vitals:   03/23/17 1016  BP: (!) 92/53  Pulse: 84  Resp: 10  Temp: (!) 96.6 F (35.9 C)  TempSrc: Oral  SpO2: 96%   There is no height or weight on file to calculate BMI. Physical Exam  Constitutional: She is oriented to person, place, and time. No distress.  Cachectic appearing female with anasarca, slight jaundice  HENT:  Head: Normocephalic and atraumatic.  Right Ear: External ear normal.  Left Ear: External ear normal.  Nose: Nose normal.  Mouth/Throat: No oropharyngeal exudate.  Eyes: Pupils are equal, round, and reactive to light. Conjunctivae and EOM are normal.  Neck: Neck supple. No JVD present.  Cardiovascular:  No murmur heard. irreg irreg  Pulmonary/Chest: Effort normal and breath sounds normal. No respiratory distress. She has no rales.  Abdominal: Soft. Bowel sounds are normal. There is tenderness. There is no rebound and no guarding.  Left upper quadrant, colostomy present with soft brown stool  Musculoskeletal: Normal range of motion.  Neurological: She is alert and oriented to person, place, and time. No cranial nerve deficit.  Skin: Skin is warm and dry.  Legs wrapped due to edema with drainage  Psychiatric:  Flat affect, but appropriate responses, has good insight into condition    Labs reviewed: Basic Metabolic Panel:  Recent Labs  16/10/96 1303  03/13/17 1048 03/13/17 1329 03/14/17 0451 03/14/17 1259 03/14/17 1447  03/15/17 0347 03/19/17 0730  NA 140  < > 142  --  145  --   --  143 140  K 3.3*  < > 2.7*  --  3.2*  --   --  3.3* 3.1*  CL 115*  < > 117*  --  119*  --   --  121*  --  CO2 19*  < > 20*  --  16*  --   --  14*  --   GLUCOSE 131*  < > 145*  --  122*  --   --  120*  --   BUN 32*  < > 30*  --  30*  --   --  28* 24*  CREATININE 1.27*  < > 1.19*  --  0.98  --   --  0.92 0.8  CALCIUM 8.1*  < > 8.7*  --  8.7*  --   --  8.4*  --   MG 1.8  --   --  1.7  --  QUANTITY NOT SUFFICIENT, UNABLE TO PERFORM TEST 2.2  --   --   PHOS 2.8  --   --   --   --   --   --   --   --   < > = values in this interval not displayed. Liver Function Tests:  Recent Labs  03/04/17 1822 03/13/17 0508 03/14/17 1259  AST 24 84* 98*  ALT 11* 126* 157*  ALKPHOS 93 138* 121  BILITOT 1.3* 0.9 0.9  PROT 7.3 4.8* 4.8*  ALBUMIN 4.1 1.9* 1.7*    Recent Labs  03/04/17 1822  LIPASE 30   No results for input(s): AMMONIA in the last 8760 hours. CBC:  Recent Labs  03/07/17 1733  03/11/17 1012 03/13/17 0508 03/14/17 0451 03/19/17 0730  WBC 5.8  < > 6.4 14.5* 11.9* 21.3  NEUTROABS 5.0  --  5.2  --   --   --   HGB 12.9  < > 13.6 14.7 15.0 12.4  HCT 39.2  < > 43.1 46.0 44.4 38  MCV 89.3  < > 91.3 90.0 87.2  --   PLT 283  < > 199 212 178 354  < > = values in this interval not displayed. Cardiac Enzymes:  Recent Labs  03/12/17 0524 03/12/17 1303 03/13/17 1329  TROPONINI 0.03* 0.05* 0.57*   BNP: Invalid input(s): POCBNP No results found for: HGBA1C Lab Results  Component Value Date   TSH 2.117 03/12/2017   No results found for: VITAMINB12 No results found for: FOLATE No results found for: IRON, TIBC, FERRITIN  Imaging and Procedures obtained prior to SNF admission: Ct Abdomen Pelvis W Contrast  Result Date: 03/07/2017 CLINICAL DATA:  Abdominal distension and lower abdominal pain for 2 weeks. History of chronic kidney disease. EXAM: CT ABDOMEN AND PELVIS WITH CONTRAST TECHNIQUE: Multidetector CT  imaging of the abdomen and pelvis was performed using the standard protocol following bolus administration of intravenous contrast. CONTRAST:  165m ISOVUE-300 IOPAMIDOL (ISOVUE-300) INJECTION 61% COMPARISON:  CT abdomen and pelvis March 04, 2017 FINDINGS: LOWER CHEST: Lung bases are clear. Included heart size is normal. No pericardial effusion. HEPATOBILIARY: Vicarious excretion of contrast in the gallbladder which is mildly distended, otherwise unremarkable. Mild periportal edema, liver is otherwise normal. PANCREAS: Re- demonstration of 14 mm lobular cystic mass in uncinate of pancreas. Given patient's age of greater than 89years old, recommend follow-up CT in 2 years. SPLEEN: 14 mm benign-appearing cyst within the spleen, unchanged. ADRENALS/URINARY TRACT: Kidneys are orthotopic, demonstrating symmetric enhancement. No nephrolithiasis, hydronephrosis or solid renal masses. The unopacified ureters are normal in course and caliber. Delayed imaging through the kidneys demonstrates symmetric prompt contrast excretion within the proximal urinary collecting system. Urinary bladder is partially distended and unremarkable. Normal adrenal glands. STOMACH/BOWEL: Large amount of retained large bowel stool with transition point in the sigmoid  colon associated with wall thickening, potential mass. Limited assessment due to streak artifact from hip arthroplasties. Small bowel feces and small and large bowel air-fluid levels. Probable gastric antral diverticulum. VASCULAR/LYMPHATIC: Aortoiliac vessels are normal in course and caliber. Mild aortic atherosclerosis. No lymphadenopathy by CT size criteria. REPRODUCTIVE: Status post hysterectomy. OTHER: Small volume ascites. No intraperitoneal free air or drainable fluid collection. MUSCULOSKELETAL: Nonacute. Streak artifact from bilateral hip total arthroplasties. Grade 1 L4-5 grade 1 L5-S1 anterolisthesis on degenerative basis. IMPRESSION: 1. Persistent distal large bowel  obstruction with transition point in sigmoid colon concerning for neoplasm, less likely stricture. Recommend colonoscopy. Large volume retained large bowel stool. 2. Increasing small volume ascites. 3. Re- demonstration of 14 mm pancreatic cystic mass. Considering patient's age of greater than 39, recommend follow-up CT in 2 years. This recommendation follows ACR consensus guidelines: Management of Incidental Pancreatic Cysts: A White Paper of the ACR Incidental Findings Committee. [place] [id number]. Aortic Atherosclerosis (ICD10-I70.0). Electronically Signed   By: Elon Alas M.D.   On: 03/07/2017 19:09   Dg Abd Acute W/chest  Result Date: 03/07/2017 CLINICAL DATA:  Vomiting for several hours EXAM: DG ABDOMEN ACUTE W/ 1V CHEST COMPARISON:  None. FINDINGS: Cardiac shadow is within normal limits. The lungs are well aerated bilaterally. Mild aortic calcifications are seen. Scattered large and small bowel gas is noted. Mild small bowel dilatation is noted consistent with at least a partial small bowel obstruction. These changes are new from the prior exam. Degenerative changes of lumbar spine are seen. Bilateral hip replacements are noted. IMPRESSION: Ages consistent with ileus partial small bowel obstruction. Electronically Signed   By: Inez Catalina M.D.   On: 03/07/2017 17:21   Dg Colon W/cm Ltd  Result Date: 03/08/2017 CLINICAL DATA:  83 year old female with large bowel obstruction. Unable to advance scope beyond the rectosigmoid junction today. EXAM: BE LIMITED WITH CONTRAST CONTRAST:  Isovue 300 was administered rectally. FLUOROSCOPY TIME:  Fluoroscopy Time:  1 minutes 36 seconds Number of Acquired Spot Images: 14 COMPARISON:  CT 03/07/2017 FINDINGS: Scout image demonstrates marked air-filled dilatation dilatation of both large and small bowel. There air-fluid levels on the lateral decubitus view. Note is made of contrast opacification within the bladder. There are bilateral hip  replacements. Contrast is seen filling the lumen of the rectum to the level of the rectosigmoid junction. Contrast material did not flow beyond this area, despite multiple attempts at repositioning the patient. This corresponds with the areas of obstruction identified on recent CT scan. There is no evidence for perforation or leakage of contrast material. The contrast material was subsequently decompressed. The patient tolerated the procedure without difficulty. IMPRESSION: High-grade obstruction at the rectosigmoid junction without passage of contrast proximally. This corresponds with the area of obstruction identified on recent CT scan. Differential includes obstructing mass, and less likely high-grade stricture at this location. Electronically Signed   By: Kristopher Oppenheim M.D.   On: 03/08/2017 17:35    Assessment/Plan 1. Large bowel obstruction (HCC) -s/p ex lap, frozen section, colostomy placement, colostomy with appropriate output considering low input -colon mass not malignant, but frozen section from peritoneum indicates probable gyn metastatic disease -pt has not had hyster or oopherectomy -has some abdominal pain on the left side primarily during transfers or positional changes--has prn roxanol for this, but is comfortable at rest  2. S/P exploratory laparotomy -see #1  3. 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 Persistent atrial fibrillation (HCC) -ongoing, had some rate control problems due to CCB being held from low bp, now also has lopressor -cont these to avoid discomfort  from afib with RVR, but d/c eliquis preventative  5. Anasarca -generalized edema of abdomen, extremities -cont to elevate affected extremities and cont wraps to bilateral legs -d/c three times' weekly weights (not needed)  6. Colostomy status ([place]) -new after surgery, pt not happy about this and does not want any part of the care associated with it -sutures from surgery to be removed here today by RN  7. Transaminitis -ongoing, due to likely metastatic disease to liver -stop checking labs  8. ACP (advance care planning) -met wth patient and her daughter for 30 minutes today reviewing goals and discussing hospice care--[facility] referral placed and immediately called in by rehab nurse  Family/ staff Communication:  Met with patient and her daughter for 30 minutes, remainder of visit was spend on medical mgt  Labs/tests ordered:  No further labs  [place]. Latalia Etzler, D.O. [facility] [address], [place] [address] Cell Phone (Mon-Fri 8am-5pm):  [phone] On Call:  [phone] & follow prompts after 5pm & weekends Office Phone:  [phone] Office Fax:  [phone]

## 2017-03-24 DIAGNOSIS — K56609 Unspecified intestinal obstruction, unspecified as to partial versus complete obstruction: Secondary | ICD-10-CM | POA: Diagnosis not present

## 2017-03-24 DIAGNOSIS — R54 Age-related physical debility: Secondary | ICD-10-CM | POA: Diagnosis not present

## 2017-03-24 DIAGNOSIS — C786 Secondary malignant neoplasm of retroperitoneum and peritoneum: Secondary | ICD-10-CM | POA: Diagnosis not present

## 2017-03-24 DIAGNOSIS — I4891 Unspecified atrial fibrillation: Secondary | ICD-10-CM | POA: Diagnosis not present

## 2017-03-24 DIAGNOSIS — C4010 Malignant neoplasm of short bones of unspecified upper limb: Secondary | ICD-10-CM | POA: Diagnosis not present

## 2017-03-24 DIAGNOSIS — C787 Secondary malignant neoplasm of liver and intrahepatic bile duct: Secondary | ICD-10-CM | POA: Diagnosis not present

## 2017-03-24 DIAGNOSIS — F411 Generalized anxiety disorder: Secondary | ICD-10-CM | POA: Diagnosis not present

## 2017-03-24 DIAGNOSIS — R609 Edema, unspecified: Secondary | ICD-10-CM | POA: Diagnosis not present

## 2017-03-24 DIAGNOSIS — L8990 Pressure ulcer of unspecified site, unspecified stage: Secondary | ICD-10-CM | POA: Diagnosis not present

## 2017-03-24 DIAGNOSIS — Z8673 Personal history of transient ischemic attack (TIA), and cerebral infarction without residual deficits: Secondary | ICD-10-CM | POA: Diagnosis not present

## 2017-03-25 DIAGNOSIS — C786 Secondary malignant neoplasm of retroperitoneum and peritoneum: Secondary | ICD-10-CM | POA: Diagnosis not present

## 2017-03-25 DIAGNOSIS — I4891 Unspecified atrial fibrillation: Secondary | ICD-10-CM | POA: Diagnosis not present

## 2017-03-25 DIAGNOSIS — Z8673 Personal history of transient ischemic attack (TIA), and cerebral infarction without residual deficits: Secondary | ICD-10-CM | POA: Diagnosis not present

## 2017-03-25 DIAGNOSIS — C4010 Malignant neoplasm of short bones of unspecified upper limb: Secondary | ICD-10-CM | POA: Diagnosis not present

## 2017-03-25 DIAGNOSIS — C787 Secondary malignant neoplasm of liver and intrahepatic bile duct: Secondary | ICD-10-CM | POA: Diagnosis not present

## 2017-03-25 DIAGNOSIS — T814XXA Infection following a procedure, initial encounter: Secondary | ICD-10-CM | POA: Diagnosis not present

## 2017-03-25 DIAGNOSIS — K56609 Unspecified intestinal obstruction, unspecified as to partial versus complete obstruction: Secondary | ICD-10-CM | POA: Diagnosis not present

## 2017-03-26 DIAGNOSIS — C787 Secondary malignant neoplasm of liver and intrahepatic bile duct: Secondary | ICD-10-CM | POA: Diagnosis not present

## 2017-03-26 DIAGNOSIS — C4010 Malignant neoplasm of short bones of unspecified upper limb: Secondary | ICD-10-CM | POA: Diagnosis not present

## 2017-03-26 DIAGNOSIS — I4891 Unspecified atrial fibrillation: Secondary | ICD-10-CM | POA: Diagnosis not present

## 2017-03-26 DIAGNOSIS — Z8673 Personal history of transient ischemic attack (TIA), and cerebral infarction without residual deficits: Secondary | ICD-10-CM | POA: Diagnosis not present

## 2017-03-26 DIAGNOSIS — K56609 Unspecified intestinal obstruction, unspecified as to partial versus complete obstruction: Secondary | ICD-10-CM | POA: Diagnosis not present

## 2017-03-26 DIAGNOSIS — C786 Secondary malignant neoplasm of retroperitoneum and peritoneum: Secondary | ICD-10-CM | POA: Diagnosis not present

## 2017-03-31 DIAGNOSIS — C787 Secondary malignant neoplasm of liver and intrahepatic bile duct: Secondary | ICD-10-CM | POA: Diagnosis not present

## 2017-03-31 DIAGNOSIS — C4010 Malignant neoplasm of short bones of unspecified upper limb: Secondary | ICD-10-CM | POA: Diagnosis not present

## 2017-03-31 DIAGNOSIS — C786 Secondary malignant neoplasm of retroperitoneum and peritoneum: Secondary | ICD-10-CM | POA: Diagnosis not present

## 2017-03-31 DIAGNOSIS — K56609 Unspecified intestinal obstruction, unspecified as to partial versus complete obstruction: Secondary | ICD-10-CM | POA: Diagnosis not present

## 2017-03-31 DIAGNOSIS — I4891 Unspecified atrial fibrillation: Secondary | ICD-10-CM | POA: Diagnosis not present

## 2017-03-31 DIAGNOSIS — Z8673 Personal history of transient ischemic attack (TIA), and cerebral infarction without residual deficits: Secondary | ICD-10-CM | POA: Diagnosis not present

## 2017-04-06 DIAGNOSIS — C786 Secondary malignant neoplasm of retroperitoneum and peritoneum: Secondary | ICD-10-CM | POA: Diagnosis not present

## 2017-04-06 DIAGNOSIS — I4891 Unspecified atrial fibrillation: Secondary | ICD-10-CM | POA: Diagnosis not present

## 2017-04-06 DIAGNOSIS — Z8673 Personal history of transient ischemic attack (TIA), and cerebral infarction without residual deficits: Secondary | ICD-10-CM | POA: Diagnosis not present

## 2017-04-06 DIAGNOSIS — K56609 Unspecified intestinal obstruction, unspecified as to partial versus complete obstruction: Secondary | ICD-10-CM | POA: Diagnosis not present

## 2017-04-06 DIAGNOSIS — C787 Secondary malignant neoplasm of liver and intrahepatic bile duct: Secondary | ICD-10-CM | POA: Diagnosis not present

## 2017-04-06 DIAGNOSIS — C4010 Malignant neoplasm of short bones of unspecified upper limb: Secondary | ICD-10-CM | POA: Diagnosis not present

## 2017-04-07 DIAGNOSIS — K56609 Unspecified intestinal obstruction, unspecified as to partial versus complete obstruction: Secondary | ICD-10-CM | POA: Diagnosis not present

## 2017-04-07 DIAGNOSIS — C4010 Malignant neoplasm of short bones of unspecified upper limb: Secondary | ICD-10-CM | POA: Diagnosis not present

## 2017-04-07 DIAGNOSIS — C786 Secondary malignant neoplasm of retroperitoneum and peritoneum: Secondary | ICD-10-CM | POA: Diagnosis not present

## 2017-04-07 DIAGNOSIS — I4891 Unspecified atrial fibrillation: Secondary | ICD-10-CM | POA: Diagnosis not present

## 2017-04-07 DIAGNOSIS — C787 Secondary malignant neoplasm of liver and intrahepatic bile duct: Secondary | ICD-10-CM | POA: Diagnosis not present

## 2017-04-07 DIAGNOSIS — Z8673 Personal history of transient ischemic attack (TIA), and cerebral infarction without residual deficits: Secondary | ICD-10-CM | POA: Diagnosis not present

## 2017-04-09 DIAGNOSIS — C4010 Malignant neoplasm of short bones of unspecified upper limb: Secondary | ICD-10-CM | POA: Diagnosis not present

## 2017-04-09 DIAGNOSIS — C786 Secondary malignant neoplasm of retroperitoneum and peritoneum: Secondary | ICD-10-CM | POA: Diagnosis not present

## 2017-04-09 DIAGNOSIS — Z8673 Personal history of transient ischemic attack (TIA), and cerebral infarction without residual deficits: Secondary | ICD-10-CM | POA: Diagnosis not present

## 2017-04-09 DIAGNOSIS — I4891 Unspecified atrial fibrillation: Secondary | ICD-10-CM | POA: Diagnosis not present

## 2017-04-09 DIAGNOSIS — K56609 Unspecified intestinal obstruction, unspecified as to partial versus complete obstruction: Secondary | ICD-10-CM | POA: Diagnosis not present

## 2017-04-09 DIAGNOSIS — C787 Secondary malignant neoplasm of liver and intrahepatic bile duct: Secondary | ICD-10-CM | POA: Diagnosis not present

## 2017-04-12 DIAGNOSIS — C786 Secondary malignant neoplasm of retroperitoneum and peritoneum: Secondary | ICD-10-CM | POA: Diagnosis not present

## 2017-04-12 DIAGNOSIS — R609 Edema, unspecified: Secondary | ICD-10-CM | POA: Diagnosis not present

## 2017-04-12 DIAGNOSIS — I4891 Unspecified atrial fibrillation: Secondary | ICD-10-CM | POA: Diagnosis not present

## 2017-04-12 DIAGNOSIS — R54 Age-related physical debility: Secondary | ICD-10-CM | POA: Diagnosis not present

## 2017-04-12 DIAGNOSIS — C4010 Malignant neoplasm of short bones of unspecified upper limb: Secondary | ICD-10-CM | POA: Diagnosis not present

## 2017-04-12 DIAGNOSIS — K56609 Unspecified intestinal obstruction, unspecified as to partial versus complete obstruction: Secondary | ICD-10-CM | POA: Diagnosis not present

## 2017-04-12 DIAGNOSIS — F411 Generalized anxiety disorder: Secondary | ICD-10-CM | POA: Diagnosis not present

## 2017-04-12 DIAGNOSIS — C787 Secondary malignant neoplasm of liver and intrahepatic bile duct: Secondary | ICD-10-CM | POA: Diagnosis not present

## 2017-04-12 DIAGNOSIS — L8990 Pressure ulcer of unspecified site, unspecified stage: Secondary | ICD-10-CM | POA: Diagnosis not present

## 2017-04-12 DIAGNOSIS — Z8673 Personal history of transient ischemic attack (TIA), and cerebral infarction without residual deficits: Secondary | ICD-10-CM | POA: Diagnosis not present

## 2017-05-13 DEATH — deceased

## 2019-03-04 IMAGING — CT CT ABD-PELV W/ CM
2 of 5 series · 14 of 46 positions shown, 16 images · IV contrast (ISOVUE)
Comparison: Radiograph 02/01/2017.

CLINICAL DATA: [AGE] with abdominal pain and distension.

EXAM:
CT ABDOMEN AND PELVIS WITH CONTRAST
TECHNIQUE: Multidetector CT imaging of the abdomen and pelvis was performed
using the standard protocol following bolus administration of
intravenous contrast.
CONTRAST:  75 cc Isovue 300 IV

[Series 2: abd/pel with · axial · 0.66mm/px · z∈[+972,+1342]mm · 11 of 84 slices shown, 13 images]
[im 5/84  soft-tissue]
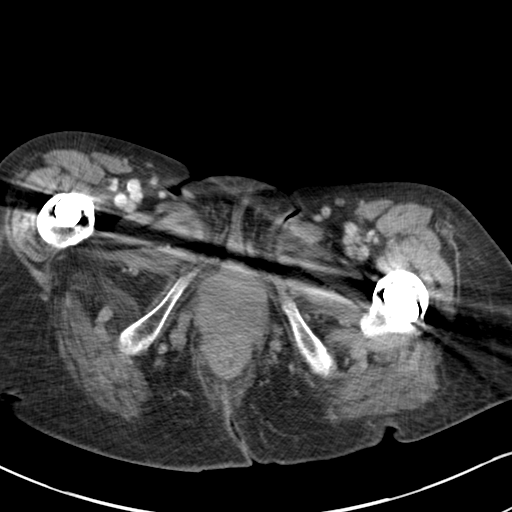
[im 5/84  bone]
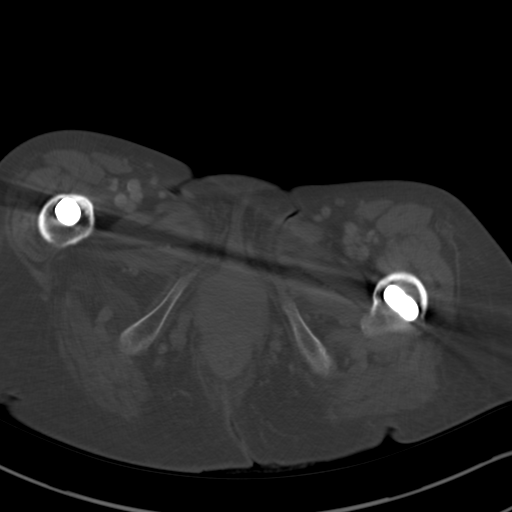
[im 15/84  soft-tissue]
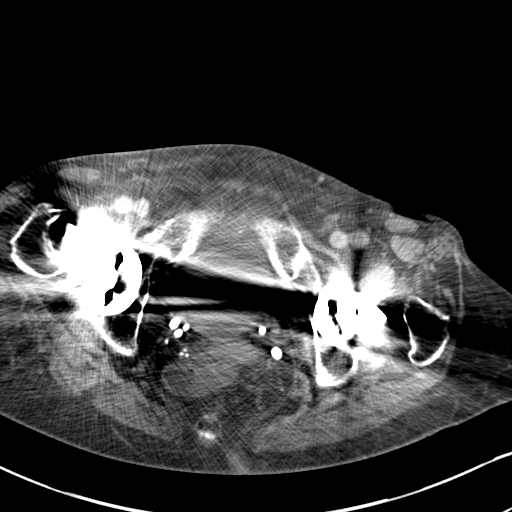
[im 20/84  soft-tissue]
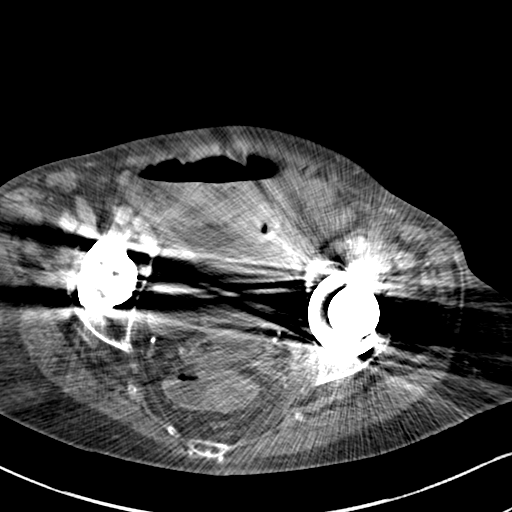
[im 30/84  soft-tissue]
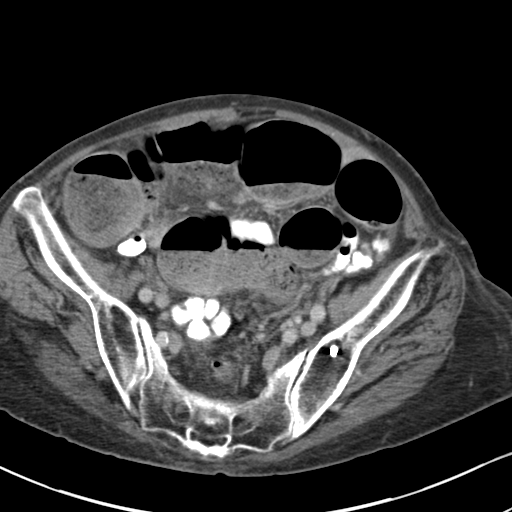
[im 35/84  soft-tissue]
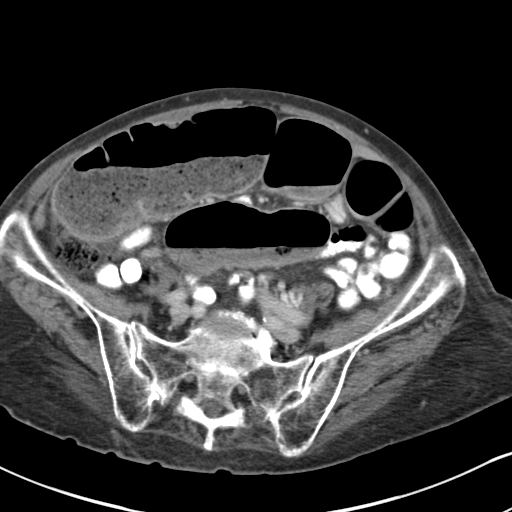
[im 44/84  soft-tissue]
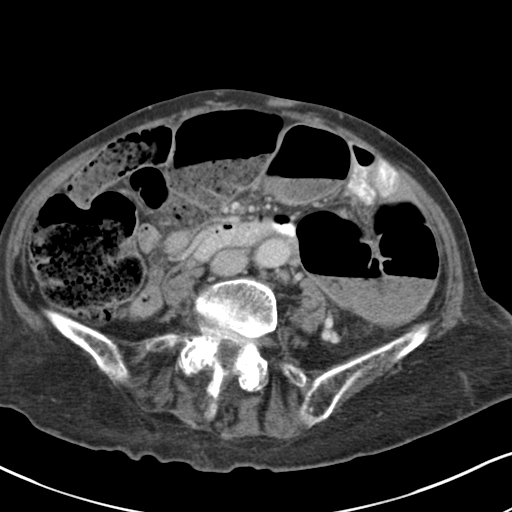
[im 49/84  soft-tissue]
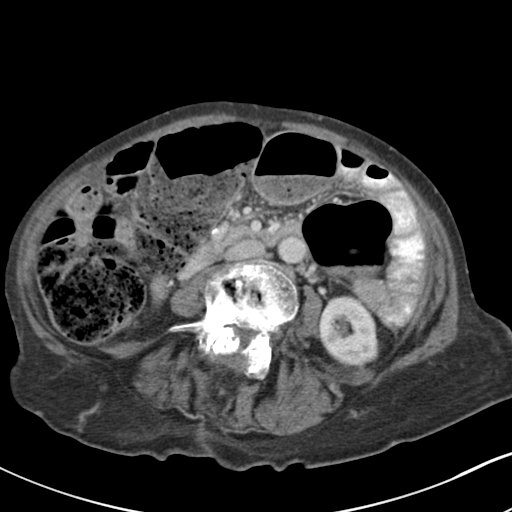
[im 54/84  soft-tissue]
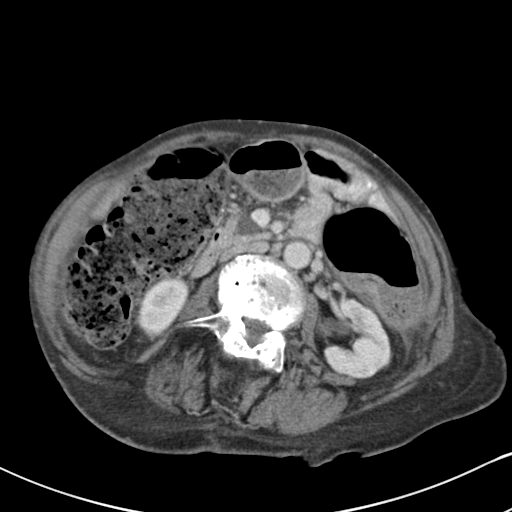
[im 64/84  soft-tissue]
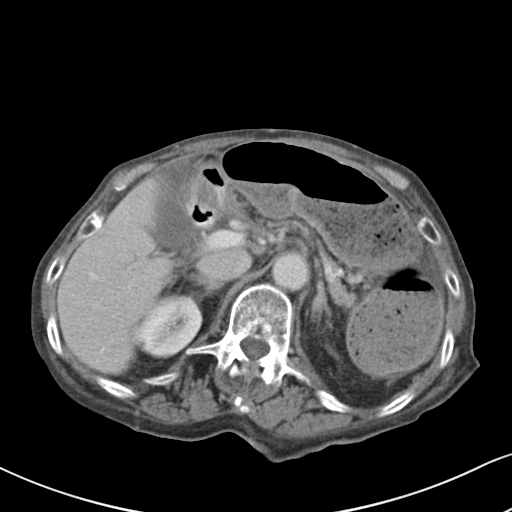
[im 64/84  bone]
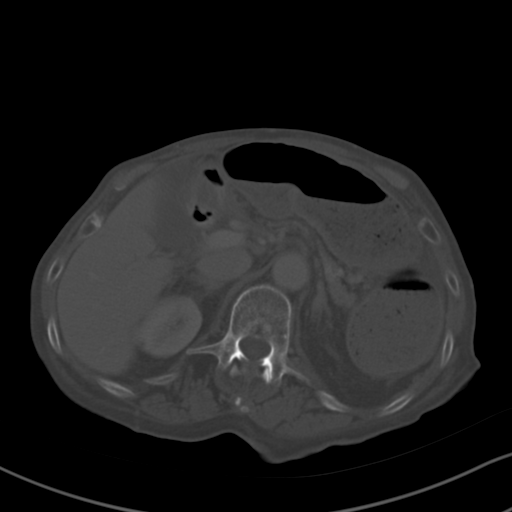
[im 69/84  soft-tissue]
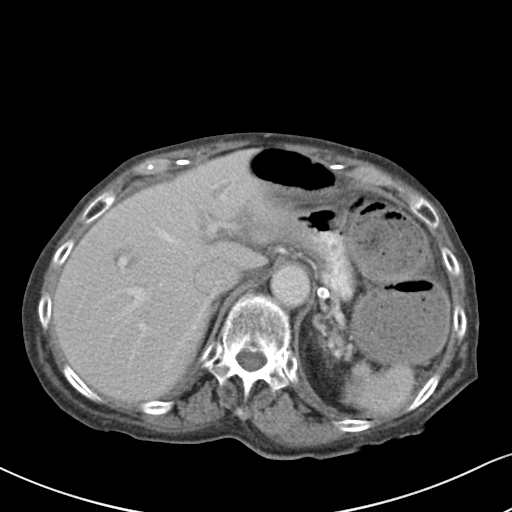
[im 79/84  soft-tissue]
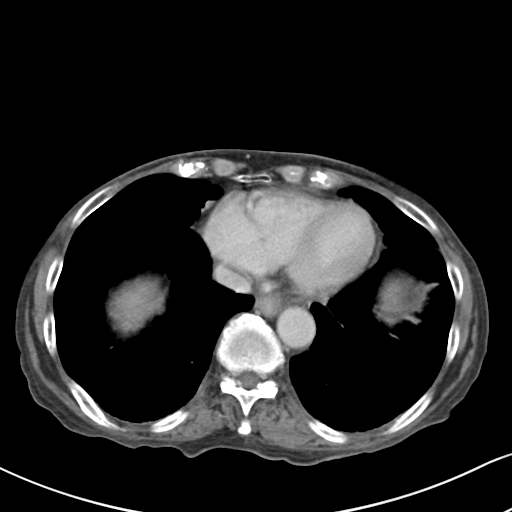

[Series 5: coronal a/|p · coronal · 0.63mm/px · 3 of 135 slices shown]
[im 45/135  soft-tissue]
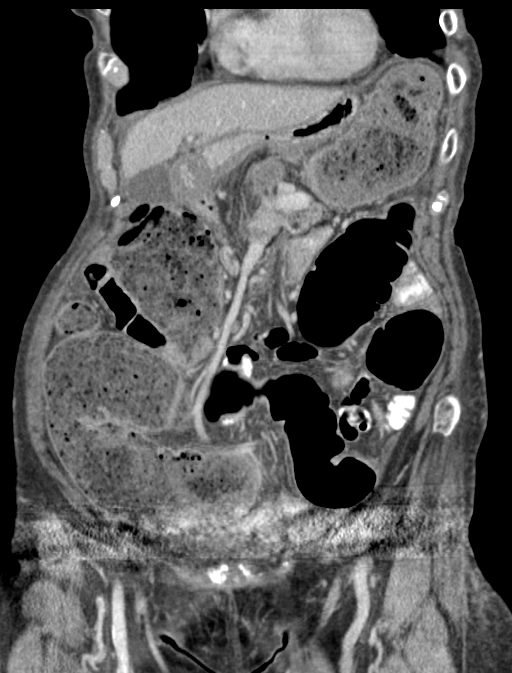
[im 60/135  soft-tissue]
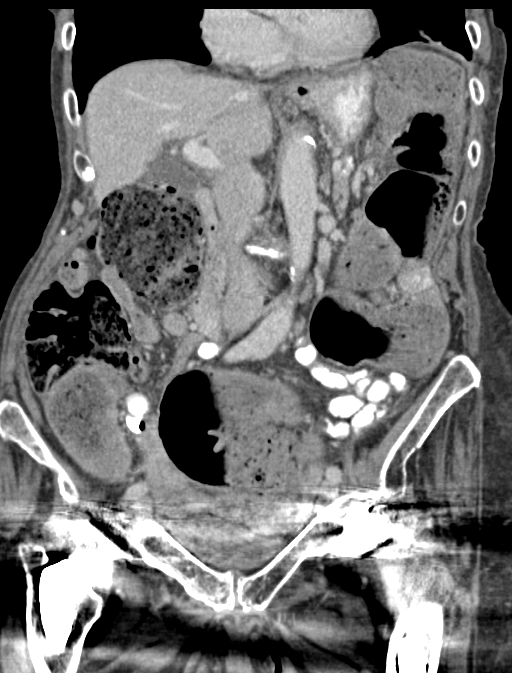
[im 75/135  soft-tissue]
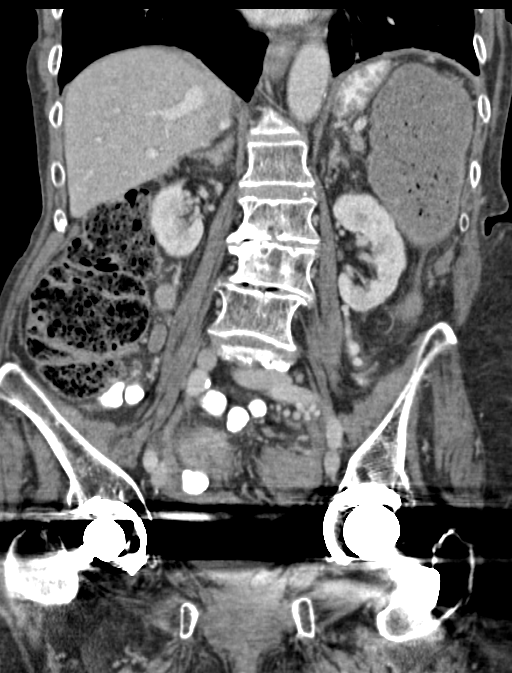

[14 of 46 positions shown; findings below may reference images not displayed]

FINDINGS: Lower chest: Linear atelectasis in the left lower lobe. No confluent
consolidation. No pleural effusion.

Hepatobiliary: No focal hepatic lesion. The gallbladder is
physiologically distended, displaced anteriorly without gallbladder
inflammation. There is mild intrahepatic biliary ductal dilatation.
Common bile duct is not well-defined.

Pancreas: Parenchymal atrophy. No ductal dilatation or inflammation.
There is a 1.3 x 1.1 cm cystic structure adjacent to the uncinate
process of pancreas.

Spleen: Small inside with irregular contours. Ill-defined
low-density lesions in the lower spleen. Minimal adjacent fluid/
edema.

Adrenals/Urinary Tract: No adrenal nodule. No hydronephrosis or
perinephric edema. Homogeneous enhancement with symmetric excretion
on delayed phase imaging. Urinary bladder is obscured by streak
artifact from bilateral hip prostheses.

Stomach/Bowel: Marked colonic stool burden with large volume of
stool in the ascending, transverse, descending and proximal sigmoid
colon with associated tortuosity. Probable transition in the mid
sigmoid image 61 series 2 with possible wall thickening. The distal
most sigmoid colon appears decompressed, however are partially
obscured by streak artifact from adjacent hip prosthesis. No
evidence of volvulus. No small bowel dilatation. There is mild
fecalization of distal small bowel contents in keeping with slow
transit. Appendix is not visualized.

Vascular/Lymphatic: Mild aortic atherosclerosis tortuosity. No
definite abdominal or pelvic adenopathy we common pelvis partially
obscured by streak artifact.

Reproductive: Pelvis and adnexa are obscured by streak artifact.
Uterus not definitively seen.

Other: No abdominal ascites, cannot assess for pelvic ascites. No
free air.

Musculoskeletal: Multilevel degenerative change in the lumbar spine,
degenerative type anterolisthesis of L4 on L5 and L5 on S1.
Bilateral hip arthroplasties. No focal bone lesion.
IMPRESSION: 1. Very large stool burden and colonic tortuosity resulting in
abdominal distension. Transition in the mid sigmoid in the regional
possible wall thickening, distal most sigmoid colon is decompressed.
This may be due to stricture, neoplasm, less likely peristalsis.
Recommend direct visualization with colonoscopy.
2. No small bowel dilatation or obstruction.
3. Small size spleen with irregular contours, low-density lesions
inferiorly. There is also mild adjacent edema/stranding. No prior
exams available for comparison. Splenic lesions are typically
benign, however if colonic neoplasm is demonstrated metastatic
disease is not excluded. Alternatively this may be sequela of prior
splenic infarct.
4. A 1.3 cm cystic lesion adjacent to the uncinate process of
pancreas. Given patient's age (greater than 80 years) recommend
follow-up CT in 2 years.
5. Mild intrahepatic biliary ductal dilatation without identifiable
cause. No abnormal gallbladder distention.
6.  Aortic Atherosclerosis ([id number]).
